# Patient Record
Sex: Female | Born: 1960 | Race: White | Hispanic: No
Health system: Midwestern US, Community
[De-identification: ages and names within clinical notes are randomized; demographics above are authoritative.]

## PROBLEM LIST (undated history)

## (undated) DIAGNOSIS — F329 Major depressive disorder, single episode, unspecified: Secondary | ICD-10-CM

## (undated) DIAGNOSIS — F419 Anxiety disorder, unspecified: Secondary | ICD-10-CM

## (undated) DIAGNOSIS — K922 Gastrointestinal hemorrhage, unspecified: Secondary | ICD-10-CM

## (undated) DIAGNOSIS — F4310 Post-traumatic stress disorder, unspecified: Secondary | ICD-10-CM

## (undated) DIAGNOSIS — E079 Disorder of thyroid, unspecified: Secondary | ICD-10-CM

## (undated) DIAGNOSIS — F32A Depression, unspecified: Secondary | ICD-10-CM

## (undated) DIAGNOSIS — J449 Chronic obstructive pulmonary disease, unspecified: Secondary | ICD-10-CM

## (undated) DIAGNOSIS — R079 Chest pain, unspecified: Principal | ICD-10-CM

## (undated) HISTORY — PX: APPENDECTOMY: SHX54

## (undated) HISTORY — PX: TUMOR REMOVAL: SHX12

## (undated) HISTORY — PX: TONSILLECTOMY: SUR1361

---

## 2015-01-01 ENCOUNTER — Emergency Department
Admission: EM | Admit: 2015-01-01 | Discharge: 2015-01-01 | Disposition: A | Discharge status: Home / Self Care | Payer: Medicare Other | Attending: Emergency Medicine | Admitting: Emergency Medicine

## 2015-01-01 ENCOUNTER — Emergency Department: Payer: Medicare Other

## 2015-01-01 ENCOUNTER — Encounter: Payer: Self-pay | Admitting: Emergency Medicine

## 2015-01-01 DIAGNOSIS — R0602 Shortness of breath: Secondary | ICD-10-CM | POA: Diagnosis present

## 2015-01-01 DIAGNOSIS — J441 Chronic obstructive pulmonary disease with (acute) exacerbation: Secondary | ICD-10-CM | POA: Diagnosis not present

## 2015-01-01 DIAGNOSIS — R42 Dizziness and giddiness: Secondary | ICD-10-CM | POA: Insufficient documentation

## 2015-01-01 HISTORY — DX: Gastrointestinal hemorrhage, unspecified: K92.2

## 2015-01-01 HISTORY — DX: Post-traumatic stress disorder, unspecified: F43.10

## 2015-01-01 HISTORY — DX: Chronic obstructive pulmonary disease, unspecified: J44.9

## 2015-01-01 HISTORY — DX: Major depressive disorder, single episode, unspecified: F32.9

## 2015-01-01 HISTORY — DX: Disorder of thyroid, unspecified: E07.9

## 2015-01-01 HISTORY — DX: Anxiety disorder, unspecified: F41.9

## 2015-01-01 HISTORY — DX: Depression, unspecified: F32.A

## 2015-01-01 LAB — CBC
HEMATOCRIT: 41.7 % (ref 35.0–47.0)
HEMOGLOBIN: 12.9 g/dL (ref 12.0–16.0)
MCH: 22.5 pg — AB (ref 26.0–34.0)
MCHC: 30.9 g/dL — ABNORMAL LOW (ref 32.0–36.0)
MCV: 72.8 fL — AB (ref 80.0–100.0)
Platelets: 361 10*3/uL (ref 150–440)
RBC: 5.73 MIL/uL — AB (ref 3.80–5.20)
RDW: 18.3 % — ABNORMAL HIGH (ref 11.5–14.5)
WBC: 6.9 10*3/uL (ref 3.6–11.0)

## 2015-01-01 LAB — BASIC METABOLIC PANEL
ANION GAP: 10 (ref 5–15)
BUN: 13 mg/dL (ref 6–20)
CHLORIDE: 97 mmol/L — AB (ref 101–111)
CO2: 32 mmol/L (ref 22–32)
Calcium: 10.1 mg/dL (ref 8.9–10.3)
Creatinine, Ser: 0.3 mg/dL — ABNORMAL LOW (ref 0.44–1.00)
GFR calc non Af Amer: >60 mL/min (ref >60)
Glucose, Bld: 91 mg/dL (ref 65–99)
Potassium: 4.3 mmol/L (ref 3.5–5.1)
Sodium: 139 mmol/L (ref 135–145)

## 2015-01-01 MED ORDER — ACETAMINOPHEN 500 MG PO TABS
ORAL_TABLET | ORAL | Status: DC
Start: 2015-01-01 — End: 2015-01-02
  Filled 2015-01-01: qty 2

## 2015-01-01 MED ORDER — IPRATROPIUM-ALBUTEROL 0.5-2.5 (3) MG/3ML IN SOLN
3.0000 mL | Freq: Once | RESPIRATORY_TRACT | Status: AC
  Administered 2015-01-01: 3 mL via RESPIRATORY_TRACT
  Filled 2015-01-01: qty 3

## 2015-01-01 MED ORDER — ACETAMINOPHEN 500 MG PO TABS
1000.0000 mg | ORAL_TABLET | Freq: Once | ORAL | Status: AC
  Administered 2015-01-01: 1000 mg via ORAL

## 2015-01-01 MED ORDER — ALBUTEROL SULFATE (2.5 MG/3ML) 0.083% IN NEBU
5.0000 mg | INHALATION_SOLUTION | Freq: Once | RESPIRATORY_TRACT | Status: DC
  Filled 2015-01-01: qty 6

## 2015-01-01 NOTE — ED Notes (Signed)
Pt is homeless and lives in her car. Pt has chronic lung disease and uses a machine that helps support her respiration and oxygen.

## 2015-01-01 NOTE — Discharge Instructions (Signed)
Chronic Obstructive Pulmonary Disease Chronic obstructive pulmonary disease (COPD) is a common lung condition in which airflow from the lungs is limited. COPD is a general term that can be used to describe many different lung problems that limit airflow, including both chronic bronchitis and emphysema. If you have COPD, your lung function will probably never return to normal, but there are measures you can take to improve lung function and make yourself feel better. CAUSES   Smoking (common).  Exposure to secondhand smoke.  Genetic problems.  Chronic inflammatory lung diseases or recurrent infections. SYMPTOMS  Shortness of breath, especially with physical activity.  Deep, persistent (chronic) cough with a large amount of thick mucus.  Wheezing.  Rapid breaths (tachypnea).  Gray or bluish discoloration (cyanosis) of the skin, especially in your fingers, toes, or lips.  Fatigue.  Weight loss.  Frequent infections or episodes when breathing symptoms become much worse (exacerbations).  Chest tightness. DIAGNOSIS Your health care provider will take a medical history and perform a physical examination to diagnose COPD. Additional tests for COPD may include:  Lung (pulmonary) function tests.  Chest X-ray.  CT scan.  Blood tests. TREATMENT  Treatment for COPD may include:  Inhaler and nebulizer medicines. These help manage the symptoms of COPD and make your breathing more comfortable.  Supplemental oxygen. Supplemental oxygen is only helpful if you have a low oxygen level in your blood.  Exercise and physical activity. These are beneficial for nearly all people with COPD.  Lung surgery or transplant.  Nutrition therapy to gain weight, if you are underweight.  Pulmonary rehabilitation. This may involve working with a team of health care providers and specialists, such as respiratory, occupational, and physical therapists. HOME CARE INSTRUCTIONS  Take all medicines  (inhaled or pills) as directed by your health care provider.  Avoid over-the-counter medicines or cough syrups that dry up your airway (such as antihistamines) and slow down the elimination of secretions unless instructed otherwise by your health care provider.  If you are a smoker, the most important thing that you can do is stop smoking. Continuing to smoke will cause further lung damage and breathing trouble. Ask your health care provider for help with quitting smoking. He or she can direct you to community resources or hospitals that provide support.  Avoid exposure to irritants such as smoke, chemicals, and fumes that aggravate your breathing.  Use oxygen therapy and pulmonary rehabilitation if directed by your health care provider. If you require home oxygen therapy, ask your health care provider whether you should purchase a pulse oximeter to measure your oxygen level at home.  Avoid contact with individuals who have a contagious illness.  Avoid extreme temperature and humidity changes.  Eat healthy foods. Eating smaller, more frequent meals and resting before meals may help you maintain your strength.  Stay active, but balance activity with periods of rest. Exercise and physical activity will help you maintain your ability to do things you want to do.  Preventing infection and hospitalization is very important when you have COPD. Make sure to receive all the vaccines your health care provider recommends, especially the pneumococcal and influenza vaccines. Ask your health care provider whether you need a pneumonia vaccine.  Learn and use relaxation techniques to manage stress.  Learn and use controlled breathing techniques as directed by your health care provider. Controlled breathing techniques include:  Pursed lip breathing. Start by breathing in (inhaling) through your nose for 1 second. Then, purse your lips as if you were   going to whistle and breathe out (exhale) through the  pursed lips for 2 seconds.  Diaphragmatic breathing. Start by putting one hand on your abdomen just above your waist. Inhale slowly through your nose. The hand on your abdomen should move out. Then purse your lips and exhale slowly. You should be able to feel the hand on your abdomen moving in as you exhale.  Learn and use controlled coughing to clear mucus from your lungs. Controlled coughing is a series of short, progressive coughs. The steps of controlled coughing are: 1. Lean your head slightly forward. 2. Breathe in deeply using diaphragmatic breathing. 3. Try to hold your breath for 3 seconds. 4. Keep your mouth slightly open while coughing twice. 5. Spit any mucus out into a tissue. 6. Rest and repeat the steps once or twice as needed. SEEK MEDICAL CARE IF:  You are coughing up more mucus than usual.  There is a change in the color or thickness of your mucus.  Your breathing is more labored than usual.  Your breathing is faster than usual. SEEK IMMEDIATE MEDICAL CARE IF:  You have shortness of breath while you are resting.  You have shortness of breath that prevents you from:  Being able to talk.  Performing your usual physical activities.  You have chest pain lasting longer than 5 minutes.  Your skin color is more cyanotic than usual.  You measure low oxygen saturations for longer than 5 minutes with a pulse oximeter. MAKE SURE YOU:  Understand these instructions.  Will watch your condition.  Will get help right away if you are not doing well or get worse.   This information is not intended to replace advice given to you by your health care provider. Make sure you discuss any questions you have with your health care provider.   Document Released: 10/18/2004 Document Revised: 01/29/2014 Document Reviewed: 09/04/2012 Elsevier Interactive Patient Education 2016 [employer]  

## 2015-01-01 NOTE — ED Notes (Signed)
Patient presents to the ED for shortness of breath.  Patient reports being recently hospitalized and states she did not feel better after hospitalization.  Patient reports being homeless and living in her car.  Patient reports needing to be on continuous oxygen.  Patient is very anxious and slightly belligerent.  Patient is speaking in full sentences.
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
## 2015-01-01 NOTE — ED Provider Notes (Signed)
[facility] Emergency Department Provider Note     Time seen: ----------------------------------------- 7:40 PM on 01/01/2015 -----------------------------------------    I have reviewed the triage vital signs and the nursing notes.   HISTORY  Chief Complaint Shortness of Breath    HPI Maria Carey is a 54 y.o. female presents ER for shortness of breath. Patient reports recently being hospitalized states she did not feel any better after hospitalization. Patient states she's homeless living in her car, she reports needing continuous oxygen, complains of pain when she breathes. Patient denies any fever or chills, reports chest pain and trouble breathing. Nothing makes her symptoms better.   Past Medical History  Diagnosis Date  . COPD (chronic obstructive pulmonary disease) (HCC)   . Thyroid disease   . GI bleed   . PTSD (post-traumatic stress disorder)   . Anxiety   . Depression     There are no active problems to display for this patient.   Past Surgical History  Procedure Laterality Date  . Tonsillectomy    . Appendectomy    . Cesarean section      x2  . Tumor removal      x2    Allergies Review of patient's allergies indicates no known allergies.  Social History Social History  Substance Use Topics  . Smoking status: Never Smoker   . Smokeless tobacco: None  . Alcohol Use: No    Review of Systems Constitutional: Negative for fever. Eyes: Negative for visual changes. ENT: Negative for sore throat. Cardiovascular: Negative for chest pain. Respiratory: Positive for shortness of breath Gastrointestinal: Negative for abdominal pain, vomiting and diarrhea. Genitourinary: Negative for dysuria. Musculoskeletal: Negative for back pain. Skin: Negative for rash. Neurological: Negative for headaches, focal weakness or numbness. Positive for dizziness  10-point ROS otherwise  negative.  ____________________________________________   PHYSICAL EXAM:  VITAL SIGNS: ED Triage Vitals  Enc Vitals Group     BP 01/01/15 1533 117/91 mmHg     Pulse Rate 01/01/15 1533 111     Resp 01/01/15 1533 26     Temp 01/01/15 1533 98 F (36.7 C)     Temp Source 01/01/15 1533 Oral     SpO2 01/01/15 1533 98 %     Weight 01/01/15 1533 75 lb (34.02 kg)     Height 01/01/15 1533 5\' 7"  (1.702 m)     Head Cir --      Peak Flow --      Pain Score 01/01/15 1535 10     Pain Loc --      Pain Edu? --      Excl. in GC? --     Constitutional: Alert, anxious, no acute distress. Eyes: Conjunctivae are normal. PERRL. Normal extraocular movements. ENT   Head: Normocephalic and atraumatic.   Nose: No congestion/rhinnorhea.   Mouth/Throat: Mucous membranes are moist.   Neck: No stridor. Cardiovascular: Rapid rate, regular rhythm. Normal and symmetric distal pulses are present in all extremities. No murmurs, rubs, or gallops. Respiratory: Mild tachypnea with mild wheezing bilaterally. Gastrointestinal: Soft and nontender. No distention. No abdominal bruits.  Musculoskeletal: Nontender with normal range of motion in all extremities. No joint effusions.  No lower extremity tenderness nor edema. Neurologic:  Normal speech and language. No gross focal neurologic deficits are appreciated. Rapid speech Skin:  Skin is warm, dry and intact. No rash noted. Psychiatric: Elevated mood, pushed speech ____________________________________________  EKG: Interpreted by me. Sinus tachycardia with a rate of 109 bpm, normal PR interval,  normal QS with, normal QT interval. Pulmonary disease pattern.  ____________________________________________  ED COURSE:  Pertinent labs & imaging results that were available during my care of the patient were reviewed by me and considered in my medical decision making (see chart for details). Patient with COPD and chronic dyspnea. I'm unclear if there is a  new issue today or not. Will check basic labs and chest x-ray. ____________________________________________    LABS (pertinent positives/negatives)  Labs Reviewed  BASIC METABOLIC PANEL - Abnormal; Notable for the following:    Chloride 97 (*)    Creatinine, Ser 0.30 (*)    All other components within normal limits  CBC - Abnormal; Notable for the following:    RBC 5.73 (*)    MCV 72.8 (*)    MCH 22.5 (*)    MCHC 30.9 (*)    RDW 18.3 (*)    All other components within normal limits  BLOOD GAS, VENOUS - Abnormal; Notable for the following:    pH, Ven 7.31 (*)    pCO2, Ven 69 (*)    Bicarbonate 34.7 (*)    Acid-Base Excess 6.0 (*)    All other components within normal limits    RADIOLOGY Images were viewed by me  IMPRESSION: No active disease. Hyperinflation is noted. Osteopenia and mild degenerative changes thoracic spine.  ____________________________________________  FINAL ASSESSMENT AND PLAN  COPD  Plan: Patient with labs and imaging as dictated above. Patient with chronic COPD and hypercarbia. I suspect this is her baseline. I have offered for her to stay at the homeless shelter. At this point I do not see any acute emergency medical condition.   Emily Filbert, MD   Emily Filbert, MD 01/01/15 [phone]

## 2015-01-01 NOTE — ED Notes (Signed)
Spoke to Care Management regarding patient.  Patient states, "I just really need some help."  Patient left [place] today after a hospitalization stating, "they really didn't help me."  Patient states she was living in [place] in her car until March of this year when she moved to [place] to receive help from her family.  Family refused to help patient, per patient and she still had to live in her car.  Patient left [place] in August to go to [place] because she lived there before.  Patient states, "[facility] [place] with help me."

## 2015-01-01 NOTE — ED Notes (Signed)
While introducing myself to the patient and attempting to connect her up to continuous monitoring, patient became anxious and did not allow this nurse to continue care due to complaints of a strong odor of deoderant or cologne.  This nurse is not wearing cologne.  Charge nurse notified.  Will have another care for this patient.

## 2015-01-05 LAB — BLOOD GAS, VENOUS
ACID-BASE EXCESS: 6 mmol/L — AB (ref 0.0–3.0)
Bicarbonate: 34.7 mEq/L — ABNORMAL HIGH (ref 21.0–28.0)
PATIENT TEMPERATURE: 37
pCO2, Ven: 69 mmHg — ABNORMAL HIGH (ref 44.0–60.0)
pH, Ven: 7.31 — ABNORMAL LOW (ref 7.320–7.430)

## 2015-07-29 ENCOUNTER — Emergency Department: Admit: 2015-07-30 | Payer: MEDICARE

## 2015-07-29 ENCOUNTER — Emergency Department: Payer: MEDICARE

## 2015-07-29 DIAGNOSIS — J441 Chronic obstructive pulmonary disease with (acute) exacerbation: Principal | ICD-10-CM

## 2015-07-29 NOTE — ED Notes (Signed)
Patient states her current IV is tender and does not wish to have contrast through it. Attempt to start additional IV was unsuccessful. Provider notified, patient returned to ED for further evaluation.

## 2015-07-29 NOTE — ED Notes (Signed)
Patient arrives today via private vehicle. Patient states she is from [place] and was told to leave the place she was living in by some official. Patient states she has been driving around trying to find a place to live and has been living in her car. Patient states she began having chest pain and her O2 compressor was no longer sufficient.

## 2015-07-29 NOTE — ED Triage Notes (Signed)
Pt to er with c/o chest pain that started this morning. Pain is intermittent, not constant

## 2015-07-29 NOTE — ED Notes (Signed)
D/w DR Coca

## 2015-07-29 NOTE — ED Notes (Signed)
Report received from Courtney, RN, assumed patient care.

## 2015-07-29 NOTE — ED Notes (Signed)
Change of shift report given to Nadine CountsBob, [place]

## 2015-07-29 NOTE — ED Notes (Signed)
Very diminished B, no LE edema

## 2015-07-29 NOTE — ED Provider Notes (Signed)
Patient is a 55 y.o. female presenting with chest pain. The history is provided by the patient.   Chest Pain (Angina)    This is a new problem. The current episode started 12 to 24 hours ago. Duration of episode(s) is 1 minute. Episode frequency: multiple episodes today. The pain is associated with normal activity. The pain is present in the left side. The pain is at a severity of 5/10. The quality of the pain is described as sharp. The pain does not radiate. Associated symptoms include cough and shortness of breath. Pertinent negatives include no abdominal pain, no back pain, no claudication, no diaphoresis, no dizziness, no exertional chest pressure, no fever, no headaches, no hemoptysis, no irregular heartbeat, no leg pain, no lower extremity edema, no malaise/fatigue, no nausea, no near-syncope, no numbness, no orthopnea, no palpitations, no PND, no sputum production, no vomiting and no weakness. She has tried nothing for the symptoms. Her past medical history is significant for cancer.Her past medical history does not include aneurysm, DM, DVT, HTN, PE or CHF. Past workup comments: none.        Past Medical History:   Diagnosis Date   ??? Chronic obstructive pulmonary disease (HCC)     emphy   ??? Endocrine disease     hypo thyroid   ??? Psychiatric disorder     anxiety, PTSD, depression       Past Surgical History:   Procedure Laterality Date   ??? HX APPENDECTOMY     ??? HX HEENT     ??? HX ORTHOPAEDIC      rt hip tumor removed         History reviewed. No pertinent family history.    Social History     Social History   ??? Marital status: DIVORCED     Spouse name: N/A   ??? Number of children: N/A   ??? Years of education: N/A     Occupational History   ??? Not on file.     Social History Main Topics   ??? Smoking status: Former Smoker   ??? Smokeless tobacco: Never Used   ??? Alcohol use No   ??? Drug use: No   ??? Sexual activity: Not on file     Other Topics Concern   ??? Not on file     Social History Narrative    ??? No narrative on file         ALLERGIES: Review of patient's allergies indicates no known allergies.    Review of Systems   Constitutional: Negative for chills, diaphoresis, fever and malaise/fatigue.   HENT: Negative for congestion, dental problem, ear discharge, ear pain, rhinorrhea, sinus pressure, sore throat and trouble swallowing.    Eyes: Negative for photophobia, pain and discharge.   Respiratory: Positive for cough and shortness of breath. Negative for hemoptysis, sputum production, chest tightness, wheezing and stridor.    Cardiovascular: Positive for chest pain. Negative for palpitations, orthopnea, claudication, leg swelling, PND and near-syncope.   Gastrointestinal: Negative for abdominal pain, blood in stool, constipation, diarrhea, nausea and vomiting.   Genitourinary: Negative for decreased urine volume, difficulty urinating, dysuria, flank pain, frequency, hematuria and urgency.   Musculoskeletal: Negative for arthralgias, back pain, myalgias, neck pain and neck stiffness.   Skin: Negative for color change, rash and wound.   Neurological: Negative for dizziness, seizures, syncope, weakness, light-headedness, numbness and headaches.   Hematological: Negative for adenopathy. Does not bruise/bleed easily.   Psychiatric/Behavioral: Negative for agitation, self-injury and suicidal ideas.  The patient is not nervous/anxious.    All other systems reviewed and are negative.      Vitals:    07/29/15 2040 07/29/15 2041 07/29/15 2056   BP:  119/69    Resp:   14   Weight: 38.6 kg (85 lb)     Height: 5\' 7"  (1.702 m)              Physical Exam   Constitutional: She is oriented to person, place, and time. She appears well-developed. She appears cachectic.  Non-toxic appearance. She has a sickly appearance. She does not appear ill. No distress.   HENT:   Head: Normocephalic and atraumatic.   Right Ear: External ear normal.   Left Ear: External ear normal.   Nose: Nose normal.    Mouth/Throat: Oropharynx is clear and moist. No oropharyngeal exudate.   Eyes: Conjunctivae are normal. Pupils are equal, round, and reactive to light. No scleral icterus.   Neck: Normal range of motion. Neck supple. No JVD present. No tracheal deviation present. No thyromegaly present.   Cardiovascular: Normal rate, normal heart sounds and intact distal pulses.    No murmur heard.  Pulmonary/Chest: Effort normal. No respiratory distress. She has decreased breath sounds. She has no wheezes. She has no rales. She exhibits no tenderness.   Abdominal: Soft. Bowel sounds are normal. She exhibits no distension and no mass. There is no tenderness. There is no rebound and no guarding.   Musculoskeletal: She exhibits no edema or tenderness.   Lymphadenopathy:     She has no cervical adenopathy.   Neurological: She is alert and oriented to person, place, and time. She has normal strength and normal reflexes. No cranial nerve deficit or sensory deficit. GCS eye subscore is 4. GCS verbal subscore is 5. GCS motor subscore is 6.   Skin: Skin is warm and dry. No rash noted.   No track marks noted   Psychiatric: She has a normal mood and affect. Her behavior is normal.   Nursing note and vitals reviewed.       MDM  Number of Diagnoses or Management Options  Chest pain, unspecified type: new and requires workup  Homeless single person: new and requires workup  SOB (shortness of breath): new and requires workup     Amount and/or Complexity of Data Reviewed  Clinical lab tests: reviewed and ordered  Tests in the radiology section of CPT??: reviewed and ordered  Discuss the patient with other providers: yes  Independent visualization of images, tracings, or specimens: yes    Critical Care  Total time providing critical care: 30-74 minutes    ED Course       Procedures    upon my initial contact with the patient, i introduced myself and informed all present that the named attending physician would be involved in their  care from its onset, asked patient if they had questions and gave them a run down of tentative treatment plan. Patient voiced understanding

## 2015-07-30 ENCOUNTER — Inpatient Hospital Stay
Admit: 2015-07-30 | Discharge: 2015-08-03 | Disposition: A | Discharge status: Home / Self Care | Payer: MEDICARE | Attending: Internal Medicine | Admitting: Internal Medicine

## 2015-07-30 LAB — EKG, 12 LEAD, INITIAL
Atrial Rate: 97 {beats}/min
Calculated P Axis: 88 degrees
Calculated R Axis: 85 degrees
Calculated T Axis: 76 degrees
P-R Interval: 110 ms
Q-T Interval: 346 ms
QRS Duration: 70 ms
QTC Calculation (Bezet): 439 ms
Ventricular Rate: 97 {beats}/min

## 2015-07-30 LAB — METABOLIC PANEL, COMPREHENSIVE
A-G Ratio: 0.9 — ABNORMAL LOW (ref 1.2–2.2)
ALT (SGPT): 18 U/L (ref 12–78)
AST (SGOT): 14 U/L — ABNORMAL LOW (ref 15–37)
Albumin: 3.6 g/dL (ref 3.4–5.0)
Alk. phosphatase: 121 U/L — ABNORMAL HIGH (ref 45–117)
Anion gap: 7 mmol/L (ref 6–15)
BUN/Creatinine ratio: 18 (ref 7–25)
BUN: 10 MG/DL (ref 7–18)
Bilirubin, total: 0.3 MG/DL (ref <1.1)
CO2: 31 mmol/L (ref 21–32)
Calcium: 8.9 MG/DL (ref 8.5–10.1)
Chloride: 100 mmol/L (ref 98–107)
Creatinine: 0.55 MG/DL — ABNORMAL LOW (ref 0.60–1.30)
GFR est AA: >60 mL/min/{1.73_m2} (ref >60)
GFR est non-AA: >60 mL/min/{1.73_m2} (ref >60)
Globulin: 4.1 g/dL — ABNORMAL HIGH (ref 2.4–3.5)
Glucose: 110 mg/dL (ref 70–110)
Potassium: 4.2 mmol/L (ref 3.5–5.3)
Protein, total: 7.7 g/dL (ref 6.4–8.2)
Sodium: 138 mmol/L (ref 136–145)

## 2015-07-30 LAB — BLOOD GAS, ARTERIAL
BASE EXCESS: 8.1 mmol/L
BICARBONATE: 31 mmol/L — ABNORMAL HIGH (ref 22–27)
O2 FLOW: 2 L/min
O2 SAT: 97 % (ref 90–100)
PCO2: 62 mmHg — CR (ref 35–45)
PO2: 97 mmHg (ref 80–100)
pH: 7.36 (ref 7.35–7.45)

## 2015-07-30 LAB — CBC WITH AUTOMATED DIFF
ABS. BASOPHILS: 0 10*3/uL (ref 0.0–0.1)
ABS. EOSINOPHILS: 0.1 10*3/uL (ref 0.0–0.5)
ABS. LYMPHOCYTES: 2.2 10*3/uL (ref 0.8–3.5)
ABS. MONOCYTES: 0.5 10*3/uL — ABNORMAL LOW (ref 0.8–3.5)
ABS. NEUTROPHILS: 6.1 10*3/uL (ref 1.5–8.0)
BASOPHILS: 0 % (ref 0–2)
EOSINOPHILS: 1 % (ref 0–5)
HCT: 42.1 % (ref 41–53)
HGB: 12.3 g/dL (ref 12.0–16.0)
LYMPHOCYTES: 25 % (ref 19–48)
MCH: 20.1 PG — ABNORMAL LOW (ref 27–31)
MCHC: 29.2 g/dL — ABNORMAL LOW (ref 31–37)
MCV: 68.7 FL — ABNORMAL LOW (ref 80–100)
MONOCYTES: 6 % (ref 3–9)
MPV: 9.4 FL (ref 5.9–10.3)
NEUTROPHILS: 68 % (ref 40–74)
PLATELET: 390 10*3/uL (ref 130–400)
RBC: 6.13 M/uL — ABNORMAL HIGH (ref 4.2–5.4)
RDW: 16.8 % — ABNORMAL HIGH (ref 11.5–14.5)
WBC: 9 10*3/uL (ref 4.5–10.8)

## 2015-07-30 LAB — TROPONIN I
Troponin-I, Qt.: <0.02 ng/mL (ref 0.00–0.05)
Troponin-I, Qt.: <0.02 ng/mL (ref 0.00–0.05)

## 2015-07-30 LAB — EKG 12-LEAD
Atrial Rate: 97 {beats}/min
P Axis: 88 degrees
P-R Interval: 110 ms
Q-T Interval: 346 ms
QRS Duration: 70 ms
QTc Calculation (Bazett): 439 ms
R Axis: 85 degrees
T Axis: 76 degrees
Ventricular Rate: 97 {beats}/min

## 2015-07-30 MED ORDER — SODIUM CHLORIDE 0.9 % IJ SYRG
Freq: Three times a day (TID) | INTRAMUSCULAR | Status: DC
Start: 2015-07-30 — End: 2015-07-30
  Administered 2015-07-30 (×2): via INTRAVENOUS

## 2015-07-30 MED ORDER — ASPIRIN 325 MG TAB
325 mg | Freq: Once | ORAL | Status: AC
Start: 2015-07-30 — End: 2015-07-29
  Administered 2015-07-30: 02:00:00 via ORAL

## 2015-07-30 MED ORDER — NITROGLYCERIN 2 % TRANSDERMAL OINTMENT
2 % | Freq: Two times a day (BID) | TRANSDERMAL | Status: DC
Start: 2015-07-30 — End: 2015-07-31
  Administered 2015-07-30 (×2): via TOPICAL

## 2015-07-30 MED ORDER — LEVOTHYROXINE 88 MCG TAB
88 mcg | Freq: Every day | ORAL | Status: DC
Start: 2015-07-30 — End: 2015-08-03
  Administered 2015-07-30 – 2015-08-03 (×5): via ORAL

## 2015-07-30 MED ORDER — IPRATROPIUM BROMIDE 0.02 % SOLN FOR INHALATION
0.02 % | RESPIRATORY_TRACT | Status: AC
Start: 2015-07-30 — End: 2015-07-29
  Administered 2015-07-30: 02:00:00 via RESPIRATORY_TRACT

## 2015-07-30 MED ORDER — METHYLPREDNISOLONE (PF) 125 MG/2 ML IJ SOLR
125 mg/2 mL | Freq: Four times a day (QID) | INTRAMUSCULAR | Status: DC
Start: 2015-07-30 — End: 2015-07-30
  Administered 2015-07-30: 02:00:00 via INTRAVENOUS

## 2015-07-30 MED ORDER — SODIUM CHLORIDE 0.9 % IJ SYRG
INTRAMUSCULAR | Status: DC | PRN
Start: 2015-07-30 — End: 2015-08-03

## 2015-07-30 MED ORDER — ENOXAPARIN 40 MG/0.4 ML SUB-Q SYRINGE
40 mg/0.4 mL | Freq: Every day | SUBCUTANEOUS | Status: DC
Start: 2015-07-30 — End: 2015-08-01
  Administered 2015-07-30: 06:00:00 via SUBCUTANEOUS

## 2015-07-30 MED ORDER — ACETAMINOPHEN 325 MG TABLET
325 mg | Freq: Four times a day (QID) | ORAL | Status: DC | PRN
Start: 2015-07-30 — End: 2015-08-03
  Administered 2015-07-30: 23:00:00 via ORAL

## 2015-07-30 MED ORDER — SODIUM CHLORIDE 0.9 % IJ SYRG
Freq: Three times a day (TID) | INTRAMUSCULAR | Status: DC
Start: 2015-07-30 — End: 2015-08-03
  Administered 2015-07-30 – 2015-08-03 (×14): via INTRAVENOUS

## 2015-07-30 MED ORDER — SODIUM CHLORIDE 0.9 % IJ SYRG
Freq: Three times a day (TID) | INTRAMUSCULAR | Status: DC
Start: 2015-07-30 — End: 2015-07-30
  Administered 2015-07-30 (×2): via INTRAVENOUS

## 2015-07-30 MED ORDER — SODIUM CHLORIDE 0.9% BOLUS IV
0.9 % | Freq: Once | INTRAVENOUS | Status: AC
Start: 2015-07-30 — End: 2015-07-29
  Administered 2015-07-30: 02:00:00 via INTRAVENOUS

## 2015-07-30 MED ORDER — METHYLPREDNISOLONE (PF) 40 MG/ML IJ SOLR
40 mg/mL | Freq: Two times a day (BID) | INTRAMUSCULAR | Status: DC
Start: 2015-07-30 — End: 2015-08-01
  Administered 2015-07-31 – 2015-08-01 (×4): via INTRAVENOUS

## 2015-07-30 MED ORDER — ALBUTEROL SULFATE 2.5 MG/0.5 ML NEB SOLUTION
2.5 mg/0.5 mL | RESPIRATORY_TRACT | Status: DC
Start: 2015-07-30 — End: 2015-08-03
  Administered 2015-07-30 – 2015-08-03 (×15): via RESPIRATORY_TRACT

## 2015-07-30 MED ORDER — IOPAMIDOL 76 % IV SOLN
370 mg iodine /mL (76 %) | Freq: Once | INTRAVENOUS | Status: AC
Start: 2015-07-30 — End: 2015-07-30
  Administered 2015-07-30: 03:00:00 via INTRAVENOUS

## 2015-07-30 MED ORDER — SODIUM CHLORIDE 0.9 % IJ SYRG
INTRAMUSCULAR | Status: DC | PRN
Start: 2015-07-30 — End: 2015-07-30

## 2015-07-30 MED ORDER — TIOTROPIUM BROMIDE 18 MCG CAPS WITH INHALATION DEVICE
18 mcg | Freq: Every day | RESPIRATORY_TRACT | Status: DC
Start: 2015-07-30 — End: 2015-08-03
  Administered 2015-07-30 – 2015-08-03 (×5): via RESPIRATORY_TRACT

## 2015-07-30 MED ORDER — METHYLPREDNISOLONE (PF) 40 MG/ML IJ SOLR
40 mg/mL | Freq: Two times a day (BID) | INTRAMUSCULAR | Status: DC
Start: 2015-07-30 — End: 2015-07-30
  Administered 2015-07-30: 13:00:00 via INTRAVENOUS

## 2015-07-30 MED ORDER — FLUTICASONE 100 MCG-VILANTEROL 25 MCG/DOSE BREATH ACTIVATED INHALER
100-25 mcg/dose | Freq: Every day | RESPIRATORY_TRACT | Status: DC
Start: 2015-07-30 — End: 2015-08-03
  Administered 2015-07-30 – 2015-08-03 (×5): via RESPIRATORY_TRACT

## 2015-07-30 MED FILL — SODIUM CHLORIDE 0.9 % IV: INTRAVENOUS | Qty: 1000

## 2015-07-30 MED FILL — NORMAL SALINE FLUSH 0.9 % INJECTION SYRINGE: INTRAMUSCULAR | Qty: 10

## 2015-07-30 MED FILL — SOLU-MEDROL (PF) 40 MG/ML SOLUTION FOR INJECTION: 40 mg/mL | INTRAMUSCULAR | Qty: 1

## 2015-07-30 MED FILL — ALBUTEROL SULFATE 2.5 MG/0.5 ML NEB SOLUTION: 2.5 mg/0.5 mL | RESPIRATORY_TRACT | Qty: 0.5

## 2015-07-30 MED FILL — SPIRIVA WITH HANDIHALER 18 MCG AND INHALATION CAPSULES: 18 mcg | RESPIRATORY_TRACT | Qty: 5

## 2015-07-30 MED FILL — ACETAMINOPHEN 325 MG TABLET: 325 mg | ORAL | Qty: 2

## 2015-07-30 MED FILL — ISOVUE-370  76 % INTRAVENOUS SOLUTION: 370 mg iodine /mL (76 %) | INTRAVENOUS | Qty: 100

## 2015-07-30 MED FILL — SOLU-MEDROL (PF) 125 MG/2 ML SOLUTION FOR INJECTION: 125 mg/2 mL | INTRAMUSCULAR | Qty: 2

## 2015-07-30 MED FILL — LOVENOX 40 MG/0.4 ML SUBCUTANEOUS SYRINGE: 40 mg/0.4 mL | SUBCUTANEOUS | Qty: 0.4

## 2015-07-30 MED FILL — NITRO-BID 2 % TRANSDERMAL OINTMENT: 2 % | TRANSDERMAL | Qty: 1

## 2015-07-30 MED FILL — LEVOTHYROXINE 88 MCG TAB: 88 mcg | ORAL | Qty: 1

## 2015-07-30 MED FILL — ASPIRIN 325 MG TAB: 325 mg | ORAL | Qty: 1

## 2015-07-30 MED FILL — BREO ELLIPTA 100 MCG-25 MCG/DOSE POWDER FOR INHALATION: 100-25 mcg/dose | RESPIRATORY_TRACT | Qty: 28

## 2015-07-30 NOTE — Progress Notes (Addendum)
Resting in bed without distress. Call light in reach. Assessment complete. Respirations E/U at rest. O2 at 2 L NC. No family at present. Yellow fall risk socks, band and sign in place. Instructed on hourly rounding and fall prevention. Erythema noted to inner buttocks, L > R. Blanchable. Verbalized she had sat in her own feces in her car.  Barrier cream applied.  Tele in place. Will continue to monitor.

## 2015-07-30 NOTE — Progress Notes (Signed)
Patient assessment complete. Patient in no acute signs/symptoms of distress. Patient bed in locked and low position, side rails up x2, call light within reach of the patient. Patient reminded to ask for assistance when needed. Will continue to monitor.    0600: Bedside and Verbal shift change report given to Paula (oncoming nurse) by Amariana Mirando (offgoing nurse). Report included the following information SBAR, Kardex, Procedure Summary, Intake/Output and Recent Results.

## 2015-07-30 NOTE — Progress Notes (Signed)
Internal Medicine Daily Progress Note    Patient: [name] [name]  Sex: female MRN: [id number]    Date of Birth:  [birth year] Age:  55 y.o.                    PCP: None    Treatment Team: Attending Provider: Rexford Maus, MD; Care Manager: Johny Chess, RN    Subjective:     Still with some shortness of breath but improving. Had chest pain yesterday but none today, no fever, no vomiting, no diarrhea    Objective:   Physical Exam:  Visit Vitals   ??? BP 92/50 (BP 1 Location: Right arm, BP Patient Position: At rest)   ??? Pulse 82   ??? Temp 97.9 ??F (36.6 ??C)   ??? Resp 16   ??? Ht  (1.702 m)   ??? Wt 38.9 kg (85 lb 11.2 oz)   ??? SpO2 100%   ??? BMI 13.42 kg/m2      Temp (24hrs), Avg:97.8 ??F (36.6 ??C), Min:97.6 ??F (36.4 ??C), Max:97.9 ??F (36.6 ??C)    Oxygen Therapy  O2 Sat (%): 100 % (07/30/15 0755)  Pulse via Oximetry: 85 beats per minute (07/30/15 0755)  O2 Device: Nasal cannula (07/30/15 0755)  O2 Flow Rate (L/min): 2 l/min (07/30/15 0755)  No intake or output data in the 24 hours ending 07/30/15 1109General: No acute distress  HEENT: Atraumatic without icterus  Neck:  Supple without adenopathy  Lungs: Decreased breath sounds on both lung fields   Heart: Regular rate and rhythm,?? No murmur, rub, or gallop  Abdomen: Soft, Non distended, Non tender, Positive bowel sounds  Extremities: No cyanosis, clubbing or edema  Neurologic:?? No focal deficits  Skin:  No rashes   Mental Status: Alert, Ox3  Psych: Looks anxious    Lab:  CMP:   Lab Results   Component Value Date/Time    NA 138 07/29/2015 08:52 PM    K 4.2 07/29/2015 08:52 PM    CL 100 07/29/2015 08:52 PM    CO2 31 07/29/2015 08:52 PM    AGAP 7 07/29/2015 08:52 PM    GLU 110 07/29/2015 08:52 PM    BUN 10 07/29/2015 08:52 PM    CREA 0.55 (L) 07/29/2015 08:52 PM    GFRAA >60 07/29/2015 08:52 PM    GFRNA >60 07/29/2015 08:52 PM    CA 8.9 07/29/2015 08:52 PM    ALB 3.6 07/29/2015 08:52 PM    TP 7.7 07/29/2015 08:52 PM    GLOB 4.1 (H) 07/29/2015 08:52 PM     AGRAT 0.9 (L) 07/29/2015 08:52 PM    SGOT 14 (L) 07/29/2015 08:52 PM    ALT 18 07/29/2015 08:52 PM     CBC:   Lab Results   Component Value Date/Time    WBC 9.0 07/29/2015 08:52 PM    HGB 12.3 07/29/2015 08:52 PM    HCT 42.1 07/29/2015 08:52 PM    PLT 390 07/29/2015 08:52 PM     All Cardiac Markers in the last 24 hours:   Lab Results   Component Value Date/Time    TROIQ <0.02 07/30/2015 06:00 AM    TROIQ <0.02 07/29/2015 08:52 PM     ?? Radiology films personally reviewed  ?? Medications reviewed    Assessment/Plan     Principal Problem:    Acute exacerbation of chronic obstructive pulmonary disease (COPD) (HCC) (07/30/2015)  Will increase dose of iv solumedrol, on duoneb nebulizer Q 4 hours  Active Problems:    Homeless single person (07/29/2015)  Case manager consulted. At this time, patient seems to be driving aimlessless and has no definite plan on where she will be going      Chest pain (07/29/2015)  For stress test, asa, lipid panel      Physical deconditioning (07/30/2015)  Consult PT OT    Chronic respiratory failure with hypoxia (HCC) (07/30/2015)  o2 supplement      Undernutrition (07/30/2015)  Consult nutrition    She will be changed to full admit because she will need a more aggressive management of Copd exacerbation. Please refer to admission H and P for ROS, PMH, surgical history, family history, medications list, allergies, social history that has not changed.
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
## 2015-07-30 NOTE — Progress Notes (Signed)
2D ADULT ECHO COMPLETED BEDSIDE 305  JOE SWIM,RDCS

## 2015-07-30 NOTE — Progress Notes (Signed)
RN case manager screened patient record for discharge planning needs. Referred to case management due to consult.  CM to follow.

## 2015-07-30 NOTE — Progress Notes (Signed)
Bedside and Verbal shift change report given to Jeremy RN (oncoming nurse) by Jamie L Johnson, RN   (offgoing nurse). Report included the following information SBAR, Kardex and MAR.   ??
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
## 2015-07-30 NOTE — ED Notes (Signed)
TRANSFER - OUT REPORT:    Verbal report given to jeremy(name) on Jacqualine Nealy  being transferred to 3c(unit) for routine progression of care       Report consisted of patient???s Situation, Background, Assessment and   Recommendations(SBAR).     Information from the following report(s) ED Summary, MAR and Recent Results was reviewed with the receiving nurse.    Lines:   Peripheral IV 07/29/15 Right Wrist (Active)   Site Assessment Clean, dry, & intact 07/29/2015  8:55 PM   Phlebitis Assessment 0 07/29/2015  8:55 PM   Infiltration Assessment 0 07/29/2015  8:55 PM   Dressing Status Clean, dry, & intact 07/29/2015  8:55 PM   Dressing Type Transparent 07/29/2015  8:55 PM   Hub Color/Line Status Pink;Flushed;Patent 07/29/2015  8:55 PM   Action Taken Blood drawn 07/29/2015  8:55 PM        Opportunity for questions and clarification was provided.      Patient transported with:   [employer]

## 2015-07-30 NOTE — Progress Notes (Signed)
Spoke with Dr. Wyatt Portelaoca regarding patient's request for nasal spray, orders received, primary nurse notified.

## 2015-07-31 LAB — LIPID PANEL
Cholesterol, total: 213 MG/DL — ABNORMAL HIGH (ref <200)
HDL Cholesterol: 71 MG/DL (ref 32–96)
LDL, calculated: 132.56 MG/DL — ABNORMAL HIGH (ref <130)
Triglyceride: 59 MG/DL (ref <150)
VLDL, calculated: 11.8 MG/DL (ref 5–32)

## 2015-07-31 LAB — CBC WITH AUTOMATED DIFF
ABS. BASOPHILS: 0 10*3/uL (ref 0.0–0.1)
ABS. EOSINOPHILS: 0 10*3/uL (ref 0.0–0.5)
ABS. LYMPHOCYTES: 0.5 10*3/uL — ABNORMAL LOW (ref 0.8–3.5)
ABS. MONOCYTES: 0.1 10*3/uL — ABNORMAL LOW (ref 2.0–8.0)
ABS. NEUTROPHILS: 5.5 10*3/uL (ref 1.5–8.0)
BASOPHILS: 0 % (ref 0–2)
EOSINOPHILS: 0 % (ref 0–5)
HCT: 34.5 % — ABNORMAL LOW (ref 41–53)
HGB: 9.7 g/dL — ABNORMAL LOW (ref 12.0–16.0)
LYMPHOCYTES: 9 % — ABNORMAL LOW (ref 19–48)
MCH: 19.4 PG — ABNORMAL LOW (ref 27–31)
MCHC: 28.1 g/dL — ABNORMAL LOW (ref 31–37)
MCV: 69 FL — ABNORMAL LOW (ref 80–100)
MONOCYTES: 1 % — ABNORMAL LOW (ref 3–9)
MPV: 9.6 FL (ref 5.9–10.3)
NEUTROPHILS: 90 % — ABNORMAL HIGH (ref 40–74)
PLATELET: 371 10*3/uL (ref 130–400)
RBC: 5 M/uL (ref 4.2–5.4)
RDW: 16.9 % — ABNORMAL HIGH (ref 11.5–14.5)
WBC: 6.1 10*3/uL (ref 4.5–10.8)

## 2015-07-31 LAB — METABOLIC PANEL, COMPREHENSIVE
A-G Ratio: 0.9 — ABNORMAL LOW (ref 1.2–2.2)
ALT (SGPT): 14 U/L (ref 12–78)
AST (SGOT): 12 U/L — ABNORMAL LOW (ref 15–37)
Albumin: 2.8 g/dL — ABNORMAL LOW (ref 3.4–5.0)
Alk. phosphatase: 89 U/L (ref 45–117)
Anion gap: 6 mmol/L (ref 6–15)
BUN/Creatinine ratio: 47 — ABNORMAL HIGH (ref 7–25)
BUN: 15 MG/DL (ref 7–18)
Bilirubin, total: 0.1 MG/DL (ref <1.1)
CO2: 30 mmol/L (ref 21–32)
Calcium: 8.2 MG/DL — ABNORMAL LOW (ref 8.5–10.1)
Chloride: 105 mmol/L (ref 98–107)
Creatinine: 0.32 MG/DL — ABNORMAL LOW (ref 0.60–1.30)
GFR est AA: >60 mL/min/{1.73_m2} (ref >60)
GFR est non-AA: >60 mL/min/{1.73_m2} (ref >60)
Globulin: 3.2 g/dL (ref 2.4–3.5)
Glucose: 143 mg/dL — ABNORMAL HIGH (ref 70–110)
Potassium: 4.1 mmol/L (ref 3.5–5.3)
Protein, total: 6 g/dL — ABNORMAL LOW (ref 6.4–8.2)
Sodium: 141 mmol/L (ref 136–145)

## 2015-07-31 LAB — TROPONIN I: Troponin-I, Qt.: <0.02 ng/mL (ref 0.00–0.05)

## 2015-07-31 MED ORDER — ATORVASTATIN 10 MG TAB
10 mg | Freq: Every evening | ORAL | Status: DC
Start: 2015-07-31 — End: 2015-08-03
  Administered 2015-08-01 – 2015-08-03 (×3): via ORAL

## 2015-07-31 MED ORDER — FAMOTIDINE 20 MG TAB
20 mg | Freq: Two times a day (BID) | ORAL | Status: DC
Start: 2015-07-31 — End: 2015-08-03
  Administered 2015-08-03 (×2): via ORAL

## 2015-07-31 MED ORDER — LORAZEPAM 0.5 MG TAB
0.5 mg | Freq: Two times a day (BID) | ORAL | Status: DC | PRN
Start: 2015-07-31 — End: 2015-08-03
  Administered 2015-07-31: 17:00:00 via ORAL

## 2015-07-31 MED ORDER — ASPIRIN 81 MG CHEWABLE TAB
81 mg | Freq: Every day | ORAL | Status: DC
Start: 2015-07-31 — End: 2015-08-03
  Administered 2015-07-31 – 2015-08-03 (×4): via ORAL

## 2015-07-31 MED ORDER — SODIUM CHLORIDE 0.65 % NASAL SPRAY AEROSOL
0.65 % | NASAL | Status: DC | PRN
Start: 2015-07-31 — End: 2015-08-03
  Administered 2015-07-31: 02:00:00 via NASAL

## 2015-07-31 MED FILL — SOLU-MEDROL (PF) 40 MG/ML SOLUTION FOR INJECTION: 40 mg/mL | INTRAMUSCULAR | Qty: 1

## 2015-07-31 MED FILL — ASPIRIN 81 MG CHEWABLE TAB: 81 mg | ORAL | Qty: 1

## 2015-07-31 MED FILL — ALBUTEROL SULFATE 2.5 MG/0.5 ML NEB SOLUTION: 2.5 mg/0.5 mL | RESPIRATORY_TRACT | Qty: 0.5

## 2015-07-31 MED FILL — LORAZEPAM 0.5 MG TAB: 0.5 mg | ORAL | Qty: 1

## 2015-07-31 MED FILL — LOVENOX 40 MG/0.4 ML SUBCUTANEOUS SYRINGE: 40 mg/0.4 mL | SUBCUTANEOUS | Qty: 0.4

## 2015-07-31 MED FILL — FAMOTIDINE 20 MG TAB: 20 mg | ORAL | Qty: 1

## 2015-07-31 MED FILL — LEVOTHYROXINE 88 MCG TAB: 88 mcg | ORAL | Qty: 1

## 2015-07-31 MED FILL — LITTLE REMEDIES 0.65 % NASAL SPRAY AEROSOL: 0.65 % | NASAL | Qty: 30
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
## 2015-07-31 NOTE — Progress Notes (Signed)
Orders received & chart reviewed. Attempted initial evaluation, but patient pleasantly refused at this time because she reports she did not get much sleep last night & needed to rest. Will check back later.

## 2015-07-31 NOTE — Progress Notes (Signed)
Problem: Nutrition Deficit  Goal: *Optimize nutritional status  Patient w/ malnutrition. Patient will have a weight gain by 08/02/15.   Outcome: Progressing Towards Goal  Nutrition assessment complete. Severely malnourished as evidenced by BMI 14.27, patient-stated wt loss >33# x past year (27.85% decrease), diminished PO intake related to limited income and homelessness, and visible fat and muscle wasting (spine, clavicle, tricep) noted upon nutrition-focused physical examination. PO intake only 33% average x last 3 documented meals on Cardiac diet. PMH includes COPD. RD added [facility] TID and will continue to follow.

## 2015-07-31 NOTE — Progress Notes (Addendum)
Patient assessment complete. Patient in no acute signs/symptoms of distress. Patient bed in locked and low position, side rails up x2, call light within reach of the patient. Patient reminded to ask for assistance when needed. Will continue to monitor.    0600: Bedside and Verbal shift change report given to Kara (oncoming nurse) by Jazalyn Mondor (offgoing nurse). Report included the following information SBAR, Kardex, Procedure Summary, Intake/Output and Recent Results.
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
## 2015-07-31 NOTE — Progress Notes (Signed)
Problem: Falls - Risk of  Goal: *Absence of falls  Patient in new environment and at risk for a fall. Patient will not fall through 08/02/15   Outcome: Progressing Towards Goal  No falls this shift.  Goal: *Knowledge of fall prevention  Patient will be reminded to use call light for assistance through 08/02/15.   Outcome: Progressing Towards Goal  Uses call light when assistance is needed.    Problem: Patient Education: Go to Patient Education Activity  Goal: Patient/Family Education  Outcome: Resolved/Met Date Met:  07/31/15  See education.    Problem: Nutrition Deficit  Goal: *Optimize nutritional status  Patient w/ malnutrition. Patient will have a weight gain by 08/02/15.   Outcome: Progressing Towards Goal  Patient has gained 8oz since admission and tolerating 30-40% of meals    Problem: Patient Education: Go to Patient Education Activity  Goal: Patient/Family Education  Outcome: Resolved/Met Date Met:  07/31/15  See education.    Problem: Pressure Injury - Risk of  Goal: *Prevention of pressure ulcer  Patient with malnutrition making patient at risk for pressure ulcer development. Patient will not develop a pressure ulcer through 08/02/15.   Outcome: Progressing Towards Goal  No s/s skin breakdown reported this shift.    Problem: Patient Education: Go to Patient Education Activity  Goal: Patient/Family Education  Outcome: Resolved/Met Date Met:  07/31/15  See education.    Problem: Breathing Pattern - Ineffective  Goal: *Absence of hypoxia  Outcome: Progressing Towards Goal  o2 sat 96-100% this shift on 2l/nc
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
## 2015-07-31 NOTE — Progress Notes (Signed)
Bedside and Verbal shift change report given to Riki RuskJeremy (Cabin crewoncoming nurse) by Gunnar Fusipaula (offgoing nurse). Report included the following information SBAR, Kardex, MAR and Recent Results.
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
## 2015-07-31 NOTE — Progress Notes (Signed)
Pt put neb down after a couple minutes. Says "These breathing txs are making my lungs tight." Pt does not want anymore txs tonight.

## 2015-07-31 NOTE — Progress Notes (Addendum)
Records obtained through care everywhere for several facilities. See chart.

## 2015-07-31 NOTE — Progress Notes (Signed)
RESTING IN BED. ASSESSMENTS COMPLETE.  NO S/S DISTRESS NOTED. SR UP X 2.  CALL LIGHT IN REACH.

## 2015-08-01 ENCOUNTER — Inpatient Hospital Stay: Admit: 2015-08-01 | Payer: MEDICARE

## 2015-08-01 LAB — TSH 3RD GENERATION: TSH: 1.15 u[IU]/mL (ref 0.35–3.74)

## 2015-08-01 LAB — D DIMER: D DIMER: 2.83 ug/ml(FEU) — CR (ref <0.49)

## 2015-08-01 LAB — D-DIMER, QUANTITATIVE: D-Dimer, Quant: 2.83 ug/ml(FEU) (ref <0.49)

## 2015-08-01 LAB — NM MYOCARDIAL SPECT REST EXERCISE OR RX: Left Ventricular Ejection Fraction: 60

## 2015-08-01 MED ORDER — TECHNETIUM TC 99M TETROFOSMIN IV KIT
Freq: Once | Status: AC
Start: 2015-08-01 — End: 2015-08-01
  Administered 2015-08-01: 14:00:00 via INTRAVENOUS

## 2015-08-01 MED ORDER — PREDNISONE 10 MG TAB
10 mg | Freq: Every day | ORAL | Status: DC
Start: 2015-08-01 — End: 2015-08-02
  Administered 2015-08-01 – 2015-08-02 (×2): via ORAL

## 2015-08-01 MED ORDER — AMINOPHYLLINE 500 MG/20 ML IV
500 mg/20 mL | Freq: Once | INTRAVENOUS | Status: AC
Start: 2015-08-01 — End: 2015-08-01
  Administered 2015-08-01: 15:00:00 via INTRAVENOUS

## 2015-08-01 MED ORDER — ENOXAPARIN 40 MG/0.4 ML SUB-Q SYRINGE
40 mg/0.4 mL | Freq: Two times a day (BID) | SUBCUTANEOUS | Status: DC
Start: 2015-08-01 — End: 2015-08-03
  Administered 2015-08-02 – 2015-08-03 (×4): via SUBCUTANEOUS

## 2015-08-01 MED ORDER — SERTRALINE 50 MG TAB
50 mg | Freq: Every day | ORAL | Status: DC
Start: 2015-08-01 — End: 2015-08-03
  Administered 2015-08-01 – 2015-08-03 (×4): via ORAL

## 2015-08-01 MED ORDER — REGADENOSON 0.4 MG/5 ML IV SYRINGE
0.4 mg/5 mL | Freq: Once | INTRAVENOUS | Status: AC
Start: 2015-08-01 — End: 2015-08-01
  Administered 2015-08-01: 14:00:00 via INTRAVENOUS

## 2015-08-01 MED ORDER — OLANZAPINE 5 MG TAB, RAPID DISSOLVE
5 mg | Freq: Every evening | ORAL | Status: DC
Start: 2015-08-01 — End: 2015-08-03
  Administered 2015-08-03 (×2): via ORAL

## 2015-08-01 MED ORDER — TECHNETIUM TC 99M TETROFOSMIN IV KIT
Freq: Once | Status: AC
Start: 2015-08-01 — End: 2015-08-01
  Administered 2015-08-01: 13:00:00 via INTRAVENOUS

## 2015-08-01 MED FILL — LOVENOX 40 MG/0.4 ML SUBCUTANEOUS SYRINGE: 40 mg/0.4 mL | SUBCUTANEOUS | Qty: 0.4

## 2015-08-01 MED FILL — PREDNISONE 10 MG TAB: 10 mg | ORAL | Qty: 4

## 2015-08-01 MED FILL — LEXISCAN 0.4 MG/5 ML INTRAVENOUS SYRINGE: 0.4 mg/5 mL | INTRAVENOUS | Qty: 5

## 2015-08-01 MED FILL — SOLU-MEDROL (PF) 40 MG/ML SOLUTION FOR INJECTION: 40 mg/mL | INTRAMUSCULAR | Qty: 1

## 2015-08-01 MED FILL — AMINOPHYLLINE 500 MG/20 ML IV: 500 mg/20 mL | INTRAVENOUS | Qty: 4

## 2015-08-01 MED FILL — TECHNETIUM TC 99M TETROFOSMIN IV KIT: Qty: 30

## 2015-08-01 MED FILL — OLANZAPINE 5 MG TAB, RAPID DISSOLVE: 5 mg | ORAL | Qty: 1

## 2015-08-01 MED FILL — LORAZEPAM 0.5 MG TAB: 0.5 mg | ORAL | Qty: 1

## 2015-08-01 MED FILL — SERTRALINE 50 MG TAB: 50 mg | ORAL | Qty: 1

## 2015-08-01 MED FILL — FAMOTIDINE 20 MG TAB: 20 mg | ORAL | Qty: 1

## 2015-08-01 MED FILL — TECHNETIUM TC 99M TETROFOSMIN IV KIT: Qty: 10

## 2015-08-01 MED FILL — ALBUTEROL SULFATE 2.5 MG/0.5 ML NEB SOLUTION: 2.5 mg/0.5 mL | RESPIRATORY_TRACT | Qty: 0.5

## 2015-08-01 MED FILL — LEVOTHYROXINE 88 MCG TAB: 88 mcg | ORAL | Qty: 1

## 2015-08-01 MED FILL — ASPIRIN 81 MG CHEWABLE TAB: 81 mg | ORAL | Qty: 1

## 2015-08-01 MED FILL — ATORVASTATIN 10 MG TAB: 10 mg | ORAL | Qty: 2
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
## 2015-08-01 NOTE — Consults (Signed)
Consult Note dictated [phone]

## 2015-08-01 NOTE — Procedures (Signed)
[facility] INDUCED TEST    Name:  [name] [name], [name] [name]  MR #:  [id number]  Account #:  [id number]  Date:  08/01/2015     Room:  305  Physician:  Woodward Ku A  Cardiology - GXT - DRUG INDUCED TEST    Reason for GXT:     Chest pain   Medications:   In Epic   Allergies:     NO KNOWN DRUG ALLERGIES   INTERPRETATION OF STRESS TEST:  Protocol:      Lexiscan  Age: 64Y  Height:   42"  Weight:   89  Sex: Female    Race:         * * * LEXISCAN 0.4  MG ADMIN. * * *    BLOOD PRESSURE      HEART RATE               MET  REST:  100/60                 REST:   100                1 MIN: 88/40                  137   2 MIN: 91/60                  134  3 MIN: 117/85                      128   MIN:                       MIN:                       PEAK:                    PEAK:      * * * POST EXERCISE RESPONSE * * *    BLOOD PRESSURE      HEART RATE  1 MIN: 102/60                 127   3 MIN: 98/60                  117   MIN:                       MIN:                           HEART - - PREDICTED MHR:      MHR ACHIEVED:   %   PEAK HR ACHIEVED:   TOTAL TIME:         TARGET (85% MHR):        STAGE:         TIME:    SYMPTOMS/REASON FOR STOPPING:     The patient had shortness of  breath but no chest pain.    PRE-EXERCISE ECG:   Sinus tachycardia, normal axis, no acute ST/T  wave changes.    EKG RESPONSE:      No significant ST segment changes noted.    No  arrhythmia.    PEAK ACTIVITY LEVEL:       METS.   MAXIMUM 02 CONSUMPTION (VO2):  INDEX OF CARDIAC WORK:        FITNESS CLASSIFICATION:       INTERPRETATION:    1. Negative electrocardiographic test in response to Lexiscan.     2. Myoview distribution report is pending.       Patient education completed.         MAF:acw  DD: 08/01/2015 00:00:00  DT:  08/01/2015 17:11:22  CC:

## 2015-08-01 NOTE — Progress Notes (Signed)
Patient OOR for testing. Will check back at a later time.
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
## 2015-08-01 NOTE — Progress Notes (Signed)
Occupational Therapy   Hospital Initial Evaluation:    NAME: [name] [name] AGE: 55 y.o.  GENDER: female  DATE: 08/01/2015  PRIMARY DIAGNOSIS: COPD exacerbation (HCC)  Chest pain  COPD (chronic obstructive pulmonary disease) (HCC)  Acute exacerbation of chronic obstructive pulmonary disease (COPD) (HCC)    Orders received, chart reviewed, and initial evaluation completed.    PLOF: per chart review-patient lives in her vehicle    Patient presents at an overall level of assistance of modified independent with basic functional mobility. Results of the evaluation consist of:   BUE AROM WFL, BUE MMS 4+ to 5-/5  Alert, oriented x 4  Balance  Sitting: Intact  Standing: Intact      Functional Transfers  Sit to Stand: Modified independent  Stand to Sit: Modified independent  Bed to Chair: Independent      Basic ADL  Feeding: Independent  Oral Facial Hygiene/Grooming: Independent  Bathing: Modified independent  Upper Body Dressing: Modified independent  Lower Body Dressing: Modified independent  Toileting: Independent      Coordination  Fine Motor Skills-Upper: Left Intact, Right Intact  Gross Motor Skills-Upper: Left Intact, Right Intact  Pain: "I hurt all over"    Based on this evaluation, the patient would not benefit from acute care occupational therapy services as patient is currently functioning at a level of independent/modified independent with self care and functional mobility. Patient [facility], however, patient c/o abscess on her buttocks that she says nursing is not aware of. This therapist notified nursing of patient c/o.     Treatment Plan: Evaluation only    The plan of care has been discussed with the patient.     Thank you for this consult.  Therapist: Delsa GranaAmber D Schweickart, OTR/L, CLT        Occupational Therapist, Certified Lymphedema Therapist
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
## 2015-08-01 NOTE — Progress Notes (Signed)
Please see my consult note on this patient.     [name] LoryAyobola Tashari Schoenfelder, MD

## 2015-08-01 NOTE — Progress Notes (Signed)
Problem: Falls - Risk of  Goal: *Absence of falls  Patient in new environment and at risk for a fall. Patient will not fall through 08/02/15   Outcome: Progressing Towards Goal  Pt has had no falls this shift.  Goal: *Knowledge of fall prevention  Patient will be reminded to use call light for assistance through 08/02/15.   Outcome: Progressing Towards Goal  Pt reminded to use call light this shift for assistance.    Problem: Nutrition Deficit  Goal: *Optimize nutritional status  Patient w/ malnutrition. Patient will have a weight gain by 08/02/15.   Outcome: Progressing Towards Goal  Pt did not show weight gain this shift, however she was NPO since midnight for stress test today.    Problem: Pressure Injury - Risk of  Goal: *Prevention of pressure ulcer  Patient with malnutrition making patient at risk for pressure ulcer development. Patient will not develop a pressure ulcer through 08/02/15.   Outcome: Progressing Towards Goal  No pressure ulcer development this shift.    Problem: Breathing Pattern - Ineffective  Goal: *Absence of hypoxia  Outcome: Progressing Towards Goal  Pt O2 Sat 97% this shift.

## 2015-08-01 NOTE — Progress Notes (Signed)
CM following for discharge needs and possible rehab placement.  CM will send referral for rehab once patient is medically stable and more psychiatrically stable.

## 2015-08-01 NOTE — Progress Notes (Signed)
0650 Lying in bed awake. Alert and oriented at present. Patient educated on today's plan of care. Pt aware of NPO status. Respirations even and unlabored. No concerns voiced at this time. Initial shift assessment complete. Side rails up x 2. Call light in place and advised to call for assistance. Will continue to monitor.

## 2015-08-01 NOTE — Progress Notes (Signed)
Initial/Spiritual Assessment, patient floor    Pt resting in bed. No family present at bedside. Pt alert and oriented. Chaplain cultivated a relationship of care and support. Chaplain facilitated storytelling and listened reflectively. Chaplain explored pt's spiritual needs and resources. Pt expressed strong feelings of anger regarding several situations in her life. Pt reflected on how these events have impacted her life. Chaplain listened empathically and reviewed pt's coping strategies. Chaplain explained Spiritual Care Services, offered empathy, and assured pt of continued support.

## 2015-08-01 NOTE — Progress Notes (Addendum)
Internal Medicine Daily Progress Note    Patient: [name] [name]  Sex: female MRN: [id number]    Date of Birth:  1960/09/09 Age:  55 y.o.                    PCP: None    Treatment Team: Attending Provider: Rexford Maus, MD; Consulting Provider: Conard Novak, MD; Care Manager: Cordie Grice, RN    Subjective:     Breathing well, paranoid, no vomiting, no further chest pain, no fever    Objective:   Physical Exam:  Visit Vitals   ??? BP 119/67 (BP 1 Location: Left arm, BP Patient Position: At rest)   ??? Pulse 96   ??? Temp 97.6 ??F (36.4 ??C)   ??? Resp 20   ??? Ht  (1.702 m)   ??? Wt 38.1 kg (84 lb 1.6 oz)   ??? SpO2 98%   ??? BMI 13.17 kg/m2      Temp (24hrs), Avg:97.8 ??F (36.6 ??C), Min:97.6 ??F (36.4 ??C), Max:98 ??F (36.7 ??C)    Oxygen Therapy  O2 Sat (%): 98 % (08/01/15 0715)  Pulse via Oximetry: 87 beats per minute (07/31/15 1929)  O2 Device: Nasal cannula (08/01/15 0715)  O2 Flow Rate (L/min): 2 l/min (08/01/15 0715)    Intake/Output Summary (Last 24 hours) at 08/01/15 1052  Last data filed at 08/01/15 0935   Gross per 24 hour   Intake              140 ml   Output             2400 ml   Net            -2260 ml   General: No acute distress, with temporal muscle wasting  HEENT: Atraumatic without icterus  Neck:  Supple without adenopathy  Lungs: Decreased breath sounds on both lung fields   Heart: Regular rate and rhythm,?? No murmur, rub, or gallop  Abdomen: Soft, Non distended, Non tender, Positive bowel sounds  Extremities: No cyanosis, clubbing or edema  Neurologic:?? No focal deficits  Skin:  No rashes   Mental Status: Alert, Ox3  Psych: Looks anxious, has paranoia    Lab:  CMP:   No results found for: NA, K, CL, CO2, AGAP, GLU, BUN, CREA, GFRAA, GFRNA, CA, MG, PHOS, ALB, TBIL, TP, ALB, GLOB, AGRAT, SGOT, ALT, GPT  CBC:   No results found for: WBC, HGB, HGBEXT, HCT, HCTEXT, PLT, PLTEXT, HGBEXT, HCTEXT, PLTEXT  All Cardiac Markers in the last 24 hours:    No results found for: CPK, CKMMB, CKMB, RCK3, CKMBT, CKNDX, CKND1, MYO, TROPT, TROIQ, TROI, TROPT, TNIPOC, BNP, BNPP  ?? Radiology films personally reviewed  ?? Medications reviewed    Assessment/Plan     Principal Problem:    Acute exacerbation of chronic obstructive pulmonary disease (COPD) (HCC) (07/30/2015)  Continue solumedrol and duoneb nebulizer    Active Problems:    Homeless single person (07/29/2015)  Case manager consulted. At this time, patient seems to be driving aimlessless and has no definite plan on where she will be going. I discussed with patient about placement and she is agreeable. If able to get placement, she will go to a facility, if she does not qualify, she agreed to go and she will be going to another undetermined place      Chest pain (07/29/2015)  For stress test, asa, lipid panel      Physical deconditioning (07/30/2015)  Consult  PT OT    Chronic respiratory failure with hypoxia (HCC) (07/30/2015)  o2 supplement. Will check with case manager regarding her O2 needs    Paranoia- she also manifest flight of ideas, anxiety  Per discussion with psychiatry, she will be going to behavioral unit once medically stable      Undernutrition (07/30/2015) with low albumin  Consult nutrition    Severe protein calorie malnutrition  Nutrition consulted    Will transfer to behavioral unit when stress test is negative and if D dimer is negative    Addendum: spoke with cardiology earlier and unable to rule out PE. D dimer is elevted. Plan to do CTA chest but unable to get good iv access. Plan to do V/Q instead but she had stress test done today and V/Q scan cannot be done for the next 48 hours. I also discussed about inpatient behavioral unit transfer once medically cleared and she agreed. Still waiting for records from [place]. She mentioned about lung biopsy but I am not clear about this. I will keep her on therapeutic dose of lovenox until PE is ruled out..    2:41 PM

## 2015-08-01 NOTE — Consults (Signed)
Consult Note dictated [id number]

## 2015-08-01 NOTE — Other (Signed)
1. Severe protein-calorie malnutrition in the setting of:    BMI 14.27, cachectic, homeless (living in car), paranoia, "Undernourished",   Dietician documentation of Severely malnourished as evidenced by BMI 14.27, patient-stated wt loss >33# x past year (27.85% decrease), diminished PO intake related to limited income and homelessness, and visible fat and muscle wasting (spine, clavicle, triceps) noted upon nutrition-focused physical examination. PO intake only 33% average x last 3 documented meals on Cardiac diet,       2. Other explanation of clinical findings    3. Unable to determine (no explanation for clinical findings)    The medical record reflects the following clinical findings, treatment, and risk factors.    Clinical Findings: Albumin 3.6 drops to 2.8, TP 7.7 drops to 6.0 (NS bolus given in ER)  Treatment: Monitoring labs, I&O's, daily weight, cardiac diet, IV fluids  Risk Factors: AECOPD, Chronic ARF with hypoxia, NPO for testing,     Please clarify and document your clinical opinion in the progress notes and discharge summary including the definitive and/or presumptive diagnosis, (suspected or probable), related to the above clinical findings. Please include clinical findings supporting your diagnosis.

## 2015-08-01 NOTE — Progress Notes (Signed)
STRESS MYOVIEW WAS COMPLETED 10:17 AM WITH NO COMPLICATIONS BY Reita MayKrista D Onetha Gaffey.    PATIENTS RESTING IMAGE DOSE IS 10.9[facility] MYOVIEW.  PATIENTS STRESS IMAGE DOSE IS 31.4[facility] MYOVIEW.    Dr prasad. WILL READ STRESS TEST.

## 2015-08-01 NOTE — Procedures (Signed)
[facility]  CARDIOLOGY - GXT - DRUG INDUCED TEST    Name:  [name], [name]  MR #:  [id number]  Account #:  [id number]  Date:  08/01/2015     Room:  305  Physician:  PUNZAL, EMILIO A  Cardiology - GXT - DRUG INDUCED TEST    Reason for GXT:     Chest pain   Medications:   In Epic   Allergies:     NO KNOWN DRUG ALLERGIES   INTERPRETATION OF STRESS TEST:  Protocol:      Lexiscan  Age: 54Y  Height:   67"  Weight:   85  Sex: Female    Race:         * * * LEXISCAN 0.4  MG ADMIN. * * *    BLOOD PRESSURE      HEART RATE               MET  REST:  100/60                 REST:   100                1 MIN: 88/40                  137   2 MIN: 91/60                  134  3 MIN: 117/85                      128   MIN:                       MIN:                       PEAK:                    PEAK:      * * * POST EXERCISE RESPONSE * * *    BLOOD PRESSURE      HEART RATE  1 MIN: 102/60                 127   3 MIN: 98/60                  117   MIN:                       MIN:                           HEART - - PREDICTED MHR:      MHR ACHIEVED:   %   PEAK HR ACHIEVED:   TOTAL TIME:         TARGET (85% MHR):        STAGE:         TIME:    SYMPTOMS/REASON FOR STOPPING:     The patient had shortness of  breath but no chest pain.    PRE-EXERCISE ECG:   Sinus tachycardia, normal axis, no acute ST/T  wave changes.    EKG RESPONSE:      No significant ST segment changes noted.    No  arrhythmia.    PEAK ACTIVITY LEVEL:       METS.   MAXIMUM 02 CONSUMPTION (VO2):      INDEX OF CARDIAC WORK:        FITNESS CLASSIFICATION:       INTERPRETATION:    1. Negative electrocardiographic test in response to Lexiscan.     2. Myoview distribution report is pending.       Patient education completed.         MAF:acw  DD: 08/01/2015 00:00:00  DT:  08/01/2015 17:11:22  CC:

## 2015-08-02 ENCOUNTER — Inpatient Hospital Stay: Admit: 2015-08-02 | Payer: MEDICARE

## 2015-08-02 LAB — CBC WITH AUTOMATED DIFF
ABS. BASOPHILS: 0 10*3/uL (ref 0.0–0.1)
ABS. EOSINOPHILS: 0 10*3/uL (ref 0.0–0.5)
ABS. LYMPHOCYTES: 0.9 10*3/uL (ref 0.8–3.5)
ABS. MONOCYTES: 0.1 10*3/uL — ABNORMAL LOW (ref 0.8–3.5)
ABS. NEUTROPHILS: 9.9 10*3/uL — ABNORMAL HIGH (ref 1.5–8.0)
BASOPHILS: 0 % (ref 0–2)
EOSINOPHILS: 0 % (ref 0–5)
HCT: 36.4 % — ABNORMAL LOW (ref 41–53)
HGB: 10.4 g/dL — ABNORMAL LOW (ref 12.0–16.0)
LYMPHOCYTES: 8 % — ABNORMAL LOW (ref 19–48)
MCH: 19.4 PG — ABNORMAL LOW (ref 27–31)
MCHC: 28.6 g/dL — ABNORMAL LOW (ref 31–37)
MCV: 68 FL — ABNORMAL LOW (ref 80–100)
MONOCYTES: 1 % — ABNORMAL LOW (ref 3–9)
MPV: 9.3 FL (ref 5.9–10.3)
NEUTROPHILS: 91 % — ABNORMAL HIGH (ref 40–74)
PLATELET: 352 10*3/uL (ref 130–400)
RBC: 5.35 M/uL (ref 4.2–5.4)
RDW: 16.8 % — ABNORMAL HIGH (ref 11.5–14.5)
WBC: 10.9 10*3/uL — ABNORMAL HIGH (ref 4.5–10.8)

## 2015-08-02 MED ORDER — IOPAMIDOL 76 % IV SOLN
370 mg iodine /mL (76 %) | Freq: Once | INTRAVENOUS | Status: AC
Start: 2015-08-02 — End: 2015-08-02
  Administered 2015-08-02: 21:00:00 via INTRAVENOUS

## 2015-08-02 MED ORDER — PREDNISONE 10 MG TAB
10 mg | Freq: Every day | ORAL | Status: DC
Start: 2015-08-02 — End: 2015-08-03
  Administered 2015-08-03: 12:00:00 via ORAL

## 2015-08-02 MED FILL — ALBUTEROL SULFATE 2.5 MG/0.5 ML NEB SOLUTION: 2.5 mg/0.5 mL | RESPIRATORY_TRACT | Qty: 0.5

## 2015-08-02 MED FILL — OLANZAPINE 5 MG TAB, RAPID DISSOLVE: 5 mg | ORAL | Qty: 1

## 2015-08-02 MED FILL — PREDNISONE 10 MG TAB: 10 mg | ORAL | Qty: 4

## 2015-08-02 MED FILL — ASPIRIN 81 MG CHEWABLE TAB: 81 mg | ORAL | Qty: 1

## 2015-08-02 MED FILL — ATORVASTATIN 10 MG TAB: 10 mg | ORAL | Qty: 2

## 2015-08-02 MED FILL — FAMOTIDINE 20 MG TAB: 20 mg | ORAL | Qty: 1

## 2015-08-02 MED FILL — ISOVUE-370  76 % INTRAVENOUS SOLUTION: 370 mg iodine /mL (76 %) | INTRAVENOUS | Qty: 100

## 2015-08-02 MED FILL — LOVENOX 40 MG/0.4 ML SUBCUTANEOUS SYRINGE: 40 mg/0.4 mL | SUBCUTANEOUS | Qty: 0.4

## 2015-08-02 MED FILL — SERTRALINE 50 MG TAB: 50 mg | ORAL | Qty: 1

## 2015-08-02 MED FILL — LEVOTHYROXINE 88 MCG TAB: 88 mcg | ORAL | Qty: 1

## 2015-08-02 NOTE — Progress Notes (Addendum)
Psychiatry Progress Note    Date: 08/02/2015  Account Number:  [id number]  Name: [name] [name]      Subjective:     Patient seen. She is paranoid about her Zyprexa but she said depression is better. She said Zoloft is helping. She is feeling hopeful today. She denies hallucination. She denies suicidal thought. She denies homicidal thought. She denies side effect to her medication.     Patient Active Problem List    Diagnosis Date Noted   ??? Severe protein-calorie malnutrition (HCC) 08/01/2015   ??? Chronic respiratory failure with hypercapnia (HCC) 07/31/2015   ??? Paranoia (HCC) 07/31/2015   ??? Acute exacerbation of chronic obstructive pulmonary disease (COPD) (HCC) 07/30/2015   ??? Physical deconditioning 07/30/2015   ??? Chronic respiratory failure with hypoxia (HCC) 07/30/2015   ??? Homeless single person 07/29/2015   ??? Chest pain 07/29/2015     Past Surgical History:   Procedure Laterality Date   ??? HX APPENDECTOMY     ??? HX HEENT     ??? HX ORTHOPAEDIC      rt hip tumor removed      No Known Allergies   Social History   Substance Use Topics   ??? Smoking status: Former Smoker   ??? Smokeless tobacco: Never Used   ??? Alcohol use No      History reviewed. No pertinent family history.   None        Objective:         Patient Vitals for the past 8 hrs:   BP Temp Pulse Resp SpO2   08/02/15 1624 - - - - 98 %   08/02/15 1515 122/78 98 ??F (36.7 ??C) (!) 110 16 98 %         Mental Status exam:     Pt appears stated age, kempt and casually dressed. Fair eye contact.  Psychomotor neutral. AAO X 3. Fair memory, concentration & attention span  Speech is rapid. Normal rate, volume and quantity  Mood is elevated and iritable and affect is intense  TP is goal directed. TC She is paranoid about her Zyprexa. She refused it yesterday No suicidal/No homicidal ideation, intent or plan  Perception, no AVH. Average intelligence and fund of knowledge  Impulse control is poor, Insight and judgement is limited.      Therapy notes nursing notes and labs in the past 24 hours reviewed and appreciated.    Assessment/Plan:   Principal Problem:    Acute exacerbation of chronic obstructive pulmonary disease (COPD) (HCC) (07/30/2015)    Active Problems:    Homeless single person (07/29/2015)      Chest pain (07/29/2015)      Physical deconditioning (07/30/2015)      Chronic respiratory failure with hypoxia (HCC) (07/30/2015)      Chronic respiratory failure with hypercapnia (HCC) (07/31/2015)      Paranoia (HCC) (07/31/2015)      Severe protein-calorie malnutrition (HCC) (08/01/2015)          Assessment    Patient still hypomanic. Depression is better. She is feeling hopeful. She is less paranoid today. She is tolerating Zoloft. She denies side effect to her medication.     Changes In Treatment Plan, Potential Side Effect and Benefit and alternatives discussed.    The following information was reviewed and discussed:     The risks and benefits of the proposed medication   patient given opportunity to ask questions   off label use of an approved drug/prescription discussed with  patient     Recommendation.    Zyprexa po 5 mg now then qhs from tomorrow  Continue Zoloft  Psychiatry will continue to follow patient on consult  Call psychiatry MD on call as needed.     Medications:    Current Facility-Administered Medications   Medication Dose Route Frequency   ??? [START ON 08/03/2015] predniSONE (DELTASONE) tablet 30 mg  30 mg Oral DAILY WITH BREAKFAST   ??? enoxaparin (LOVENOX) injection 40 mg  1 mg/kg SubCUTAneous Q12H   ??? sertraline (ZOLOFT) tablet 25 mg  25 mg Oral DAILY   ??? OLANZapine (ZyPREXA zydis) disintegrating tablet 5 mg  5 mg Oral QHS   ??? famotidine (PEPCID) tablet 20 mg  20 mg Oral BID   ??? atorvastatin (LIPITOR) tablet 20 mg  20 mg Oral QHS   ??? aspirin chewable tablet 81 mg  81 mg Oral DAILY   ??? LORazepam (ATIVAN) tablet 0.5 mg  0.5 mg Oral BID PRN   ??? fluticasone-vilanterol (BREO ELLIPTA) 16800mcg-25mcg/puff  1 Puff Inhalation DAILY    ??? levothyroxine (SYNTHROID) tablet 88 mcg  88 mcg Oral ACB   ??? tiotropium (SPIRIVA) inhalation capsule 18 mcg  1 Cap Inhalation DAILY   ??? sodium chloride (NS) flush 5-10 mL  5-10 mL IntraVENous Q8H   ??? sodium chloride (NS) flush 5-10 mL  5-10 mL IntraVENous PRN   ??? albuterol/ipratropium (DUONEB) neb solution  1 Dose Nebulization Q4H RT   ??? acetaminophen (TYLENOL) tablet 650 mg  650 mg Oral Q6H PRN   ??? sodium chloride (OCEAN) 0.65 % nasal spray 2 Spray  2 Spray Both Nostrils Q2H PRN           Signed By: Conard NovakAyobola A Hennie Gosa, MD
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
## 2015-08-02 NOTE — Progress Notes (Signed)
Internal Medicine Daily Progress Note    Patient: [name] [name]  Sex: female MRN: [id number]    Date of Birth:  19-Nov-1960 Age:  55 y.o.                    PCP: None    Treatment Team: Attending Provider: Rexford Maus, MD; Consulting Provider: Conard Novak, MD; Care Manager: Cordie Grice, RN    Subjective:     Breathing well, paranoid, no vomiting, no further chest pain, no fever, she said she is breathing better today.    Objective:   Physical Exam:  Visit Vitals   ??? BP 113/66 (BP 1 Location: Left arm, BP Patient Position: At rest)   ??? Pulse 100   ??? Temp 98 ??F (36.7 ??C)   ??? Resp 16   ??? Ht  (1.702 m)   ??? Wt 38.3 kg (84 lb 6.4 oz)   ??? SpO2 98%   ??? BMI 13.22 kg/m2      Temp (24hrs), Avg:97.9 ??F (36.6 ??C), Min:97.8 ??F (36.6 ??C), Max:98.2 ??F (36.8 ??C)    Oxygen Therapy  O2 Sat (%): 98 % (08/02/15 1151)  Pulse via Oximetry: 98 beats per minute (08/02/15 1151)  O2 Device: Nasal cannula (08/02/15 1151)  O2 Flow Rate (L/min): 2 l/min (08/02/15 1151)    Intake/Output Summary (Last 24 hours) at 08/02/15 1427  Last data filed at 08/02/15 1302   Gross per 24 hour   Intake              660 ml   Output              850 ml   Net             -190 ml   General: No acute distress, with temporal muscle wasting  HEENT: Atraumatic without icterus  Neck:  Supple without adenopathy  Lungs: Decreased breath sounds on both lung fields   Heart: Regular rate and rhythm,?? No murmur, rub, or gallop  Abdomen: Soft, Non distended, Non tender, Positive bowel sounds  Extremities: No cyanosis, clubbing or edema  Neurologic:?? No focal deficits  Skin:  No rashes   Mental Status: Alert, Ox3  Psych: Looks anxious, has paranoia    Lab:  CMP:   No results found for: NA, K, CL, CO2, AGAP, GLU, BUN, CREA, GFRAA, GFRNA, CA, MG, PHOS, ALB, TBIL, TP, ALB, GLOB, AGRAT, SGOT, ALT, GPT  CBC:   No results found for: WBC, HGB, HGBEXT, HCT, HCTEXT, PLT, PLTEXT, HGBEXT, HCTEXT, PLTEXT  All Cardiac Markers in the last 24 hours:    No results found for: CPK, CKMMB, CKMB, RCK3, CKMBT, CKNDX, CKND1, MYO, TROPT, TROIQ, TROI, TROPT, TNIPOC, BNP, BNPP  ?? Radiology films personally reviewed  ?? Medications reviewed    Assessment/Plan     Principal Problem:    Acute exacerbation of chronic obstructive pulmonary disease (COPD) (HCC) (07/30/2015)  Decrease po prednisone and duoneb nebulizer    Active Problems:    Homeless single person (07/29/2015)  Still unclear where she plans to go when discharged      Chest pain (07/29/2015)  Atypical. Stress test did not show ischemia  V/Q scan tomorrow      Physical deconditioning (07/30/2015)  Consult PT OT    Chronic respiratory failure with hypoxia (HCC) (07/30/2015)  o2 supplement. Will check with case manager regarding her O2 needs    Paranoia- she also manifest flight of ideas, anxiety  Per discussion  with psychiatry, she will be going to behavioral unit once medically stable      Undernutrition (07/30/2015) with low albumin  Consult nutrition    Severe protein calorie malnutrition  Nutrition consulted    I received voluminous chart from [place] that stated she had a suspicious lung mass last May 2017 for which she underwent bronchoscopy and that was negative. She had PET scan done and results are unknown. There was a note from pulm in [place] that states she is high risk for surgical intervention if she has lung cancer. She had been seen at different medical centers when she was in [place] and case managers note that they were unable to get her services because she is homeless. Will get CT scan of chest with iv contrast and if abnormal, may need to consult pulmonary. She is scheduled for V/Q scan tomorrow. On empiric therapeutic dose of lovenox for now

## 2015-08-02 NOTE — Progress Notes (Addendum)
[id number]  Rn shift assessment completed, pt instructed on today's plan of care. Pt needs, questions and concerns addressed. Safety measures and fall prevention instituted. Pt resting quietly with call light in hand. Pt still appears anxious this am.       1800 Bedside and Verbal shift change report given to Thayer Ohmhris (Cabin crewoncoming nurse) by Lynford HumphreyShanda (offgoing nurse). Report included the following information SBAR, Kardex and MAR.

## 2015-08-02 NOTE — Other (Signed)
Patient assessment complete. Patient in bed ,locked and lowest position, side rails up x2, call light within reach of the patient. Patient in no acute s/s of distress. No voiced concerns at this time. Patient reminded to ask/call out for assistance when needed. Will continue to monitor.

## 2015-08-03 ENCOUNTER — Inpatient Hospital Stay: Admit: 2015-08-03 | Payer: MEDICARE

## 2015-08-03 ENCOUNTER — Inpatient Hospital Stay

## 2015-08-03 LAB — METABOLIC PANEL, BASIC
Anion gap: 4 mmol/L — ABNORMAL LOW (ref 6–15)
BUN/Creatinine ratio: 51 — ABNORMAL HIGH (ref 7–25)
BUN: 20 MG/DL — ABNORMAL HIGH (ref 7–18)
CO2: 34 mmol/L — ABNORMAL HIGH (ref 21–32)
Calcium: 8.3 MG/DL — ABNORMAL LOW (ref 8.5–10.1)
Chloride: 105 mmol/L (ref 98–107)
Creatinine: 0.39 MG/DL — ABNORMAL LOW (ref 0.60–1.30)
GFR est AA: >60 mL/min/{1.73_m2} (ref >60)
GFR est non-AA: >60 mL/min/{1.73_m2} (ref >60)
Glucose: 99 mg/dL (ref 70–110)
Potassium: 4 mmol/L (ref 3.5–5.3)
Sodium: 143 mmol/L (ref 136–145)

## 2015-08-03 LAB — CBC WITH AUTOMATED DIFF
ABS. EOSINOPHILS: 0.1 10*3/uL (ref 0.0–0.5)
ABS. LYMPHOCYTES: 2.5 10*3/uL (ref 0.8–3.5)
ABS. MONOCYTES: 0.7 10*3/uL — ABNORMAL LOW (ref 0.8–3.5)
ABS. NEUTROPHILS: 4.9 10*3/uL (ref 1.5–8.0)
EOSINOPHILS: 1 % (ref 0–5)
HCT: 34.7 % — ABNORMAL LOW (ref 41–53)
HGB: 9.7 g/dL — ABNORMAL LOW (ref 12.0–16.0)
LYMPHOCYTES: 31 % (ref 19–48)
MCH: 19.5 PG — ABNORMAL LOW (ref 27–31)
MCHC: 28 g/dL — ABNORMAL LOW (ref 31–37)
MCV: 69.8 FL — ABNORMAL LOW (ref 80–100)
MONOCYTES: 8 % (ref 3–9)
MPV: 9.5 FL (ref 5.9–10.3)
NEUTROPHILS: 60 % (ref 40–74)
PLATELET: 359 10*3/uL (ref 130–400)
RBC: 4.97 M/uL (ref 4.2–5.4)
RDW: 17.5 % — ABNORMAL HIGH (ref 11.5–14.5)
WBC: 8.2 10*3/uL (ref 4.5–10.8)

## 2015-08-03 MED ORDER — FAMOTIDINE 20 MG TAB
20 mg | ORAL_TABLET | Freq: Two times a day (BID) | ORAL | 0 refills | Status: AC
Start: 2015-08-03 — End: ?

## 2015-08-03 MED ORDER — OLANZAPINE 5 MG TAB, RAPID DISSOLVE
5 mg | ORAL_TABLET | Freq: Every evening | ORAL | 0 refills | Status: AC
Start: 2015-08-03 — End: ?

## 2015-08-03 MED ORDER — ASPIRIN 81 MG CHEWABLE TAB
81 mg | ORAL_TABLET | Freq: Every day | ORAL | 0 refills | Status: AC
Start: 2015-08-03 — End: ?

## 2015-08-03 MED ORDER — TECHNETIUM PENTETATE
Freq: Once | Status: AC
Start: 2015-08-03 — End: 2015-08-03
  Administered 2015-08-03: 14:00:00 via INTRAVENOUS

## 2015-08-03 MED ORDER — TECHNETIUM TO 99M ALBUMIN AGGREGATED
Freq: Once | Status: AC
Start: 2015-08-03 — End: 2015-08-03
  Administered 2015-08-03: 14:00:00 via INTRAVENOUS

## 2015-08-03 MED ORDER — PREDNISONE 10 MG TAB
10 mg | ORAL_TABLET | ORAL | 0 refills | Status: AC
Start: 2015-08-03 — End: ?

## 2015-08-03 MED ORDER — SERTRALINE 25 MG TAB
25 mg | ORAL_TABLET | Freq: Every day | ORAL | 0 refills | Status: AC
Start: 2015-08-03 — End: ?

## 2015-08-03 MED FILL — LEVOTHYROXINE 88 MCG TAB: 88 mcg | ORAL | Qty: 1

## 2015-08-03 MED FILL — ATORVASTATIN 10 MG TAB: 10 mg | ORAL | Qty: 2

## 2015-08-03 MED FILL — SERTRALINE 50 MG TAB: 50 mg | ORAL | Qty: 1

## 2015-08-03 MED FILL — TECHNETIUM TO 99M ALBUMIN AGGREGATED: Qty: 3

## 2015-08-03 MED FILL — LOVENOX 40 MG/0.4 ML SUBCUTANEOUS SYRINGE: 40 mg/0.4 mL | SUBCUTANEOUS | Qty: 0.4

## 2015-08-03 MED FILL — ALBUTEROL SULFATE 2.5 MG/0.5 ML NEB SOLUTION: 2.5 mg/0.5 mL | RESPIRATORY_TRACT | Qty: 0.5

## 2015-08-03 MED FILL — PREDNISONE 10 MG TAB: 10 mg | ORAL | Qty: 3

## 2015-08-03 MED FILL — TECHNETIUM PENTETATE: Qty: 35

## 2015-08-03 MED FILL — ASPIRIN 81 MG CHEWABLE TAB: 81 mg | ORAL | Qty: 1

## 2015-08-03 MED FILL — OLANZAPINE 5 MG TAB, RAPID DISSOLVE: 5 mg | ORAL | Qty: 1

## 2015-08-03 MED FILL — FAMOTIDINE 20 MG TAB: 20 mg | ORAL | Qty: 1

## 2015-08-03 NOTE — Discharge Summary (Signed)
Discharge Summary      Patient: [name] [name]               Sex: female            MRN: [id number]      Date of Birth:  11/22/1960      Age:  55 y.o.        None            Admit Date: 07/29/2015    Discharge Date: 08/03/2015    Admission Diagnoses: COPD exacerbation (HCC)  Chest pain  COPD (chronic obstructive pulmonary disease) (HCC)  Acute exacerbation of chronic obstructive pulmonary disease (COPD) (HCC)    Discharge Diagnoses:    Problem List as of 08/03/2015  Date Reviewed: 08-03-2015          Codes Class Noted - Resolved    Severe protein-calorie malnutrition (HCC) ICD-10-CM: E43  ICD-9-CM: 262  08/01/2015 - Present        Chronic respiratory failure with hypercapnia (HCC) ICD-10-CM: J96.12  ICD-9-CM: 518.83  07/31/2015 - Present        Paranoia (HCC) ICD-10-CM: F22  ICD-9-CM: 297.1  07/31/2015 - Present        * (Principal)Acute exacerbation of chronic obstructive pulmonary disease (COPD) (HCC) ICD-10-CM: J44.1  ICD-9-CM: 491.21  Aug 03, 2015 - Present        Physical deconditioning ICD-10-CM: R53.81  ICD-9-CM: 799.3  08-03-2015 - Present        Chronic respiratory failure with hypoxia (HCC) ICD-10-CM: J96.11  ICD-9-CM: 518.83, 799.02  Aug 03, 2015 - Present        Homeless single person ICD-10-CM: Z59.0  ICD-9-CM: V60.0  07/29/2015 - Present        Chest pain ICD-10-CM: R07.9  ICD-9-CM: 786.50  07/29/2015 - Present              Discharge Condition: Good    Brief HPI & Hospital Course:     [name] is a 55 y.o. female who presents here with sob that started in the last days, has been living in her car and using the 02 concentrator, has no fevers, no chills no n/v, no cough, otherwise sob improved since being in the ER has no other complains, said used to smoke in the past and exposed to multiple fumes in her job, currently also mentions cp that happened today , says with out 02 can not walk much. She was admitted to telemetry. Serial troponin were done and they were negative. She had stress test that did not show ischemia. She has  acute copd exacerbation and was given duoneb nebulizer and iv solumedrol. D dimer was elevated and V/Q scan was low probility for PE. She was temporarily on full dose lovenox. We obtained records from [place] and she had work up for possible lung mass. I repeated CT chest with contrast that showed right sided 5 m lung nodule and right lung scar. I advised her to follow up with her pulmonologist. She as paranoia. She was seen by psychiatry and she was given zyprexa and zoloft. She was recommended to go to behavioral unit after discharge from acute care but she refused it. She is homeless and requested to be discharged. She has O2 concentrator and unable to get continuos home O2 because she does not have established address. She was advised to go to behavioral so we can get her help but she refused. She is updated on her pneumonia vaccine    Consults: Dr. Armando Gang for psychiatry  Discharge Medications:     Current Discharge Medication List      START taking these medications    Details   aspirin 81 mg chewable tablet Take 1 Tab by mouth daily.  Qty: 30 Tab, Refills: 0      famotidine (PEPCID) 20 mg tablet Take 1 Tab by mouth two (2) times a day.  Qty: 60 Tab, Refills: 0      OLANZapine (ZYPREXA ZYDIS) 5 mg disintegrating tablet Take 1 Tab by mouth nightly.  Qty: 30 Tab, Refills: 0      predniSONE (DELTASONE) 10 mg tablet 3 tabs once a day for 2 days then  2 tabs once a day for 2 days then  1 tab once a day for 2 days then  1/2 tab once a day for 2 days then stop  Qty: 13 Tab, Refills: 0      sertraline (ZOLOFT) 25 mg tablet Take 1 Tab by mouth daily.  Qty: 30 Tab, Refills: 0         CONTINUE these medications which have NOT CHANGED    Details   fluticasone-vilanterol (BREO ELLIPTA) 100-25 mcg/dose inhaler Take 1 Puff by inhalation daily.      sodium chloride (SALINE NASAL) 0.65 % nasal spray 2 Sprays by Both Nostrils route as needed for Congestion. Indications: Nasal Congestion      levothyroxine (SYNTHROID) 88  mcg tablet Take 88 mcg by mouth Daily (before breakfast).      tiotropium (SPIRIVA WITH HANDIHALER) 18 mcg inhalation capsule Take 1 Cap by inhalation daily.      OXYGEN-AIR DELIVERY SYSTEMS 2 L by Does Not Apply route.           Recent Results (from the past 12 hour(s))   METABOLIC PANEL, BASIC    Collection Time: 08/03/15  5:35 AM   Result Value Ref Range    Sodium 143 136 - 145 mmol/L    Potassium 4.0 3.5 - 5.3 mmol/L    Chloride 105 98 - 107 mmol/L    CO2 34 (H) 21 - 32 mmol/L    Anion gap 4 (L) 6 - 15 mmol/L    Glucose 99 70 - 110 mg/dL    BUN 20 (H) 7 - 18 MG/DL    Creatinine 1.61 (L) 0.60 - 1.30 MG/DL    BUN/Creatinine ratio 51 (H) 7 - 25      GFR est AA >60 >60 ml/min/1.12m2    GFR est non-AA >60 >60 ml/min/1.61m2    Calcium 8.3 (L) 8.5 - 10.1 MG/DL   CBC WITH AUTOMATED DIFF    Collection Time: 08/03/15  5:35 AM   Result Value Ref Range    WBC 8.2 4.5 - 10.8 K/uL    RBC 4.97 4.2 - 5.4 M/uL    HGB 9.7 (L) 12.0 - 16.0 g/dL    HCT 09.6 (L) 41 - 53 %    MCV 69.8 (L) 80 - 100 FL    MCH 19.5 (L) 27 - 31 PG    MCHC 28.0 (L) 31 - 37 g/dL    RDW 04.5 (H) 40.9 - 14.5 %    PLATELET 359 130 - 400 K/uL    MPV 9.5 5.9 - 10.3 FL    NEUTROPHILS 60 40 - 74 %    LYMPHOCYTES 31 19 - 48 %    MONOCYTES 8 3 - 9 %    EOSINOPHILS 1 0 - 5 %    ABS. NEUTROPHILS 4.9 1.5 - 8.0 K/UL  ABS. LYMPHOCYTES 2.5 0.8 - 3.5 K/UL    ABS. MONOCYTES 0.7 (L) 0.8 - 3.5 K/UL    ABS. EOSINOPHILS 0.1 0.0 - 0.5 K/UL    DF AUTOMATED      RBC COMMENTS 1+  HYPOCHROMIA       RBC COMMENTS 1+  ANISOCYTOSIS       RBC COMMENTS 1+  MICROCYTOSIS          Activity: Activity as tolerated    Diet: Regular Diet    Wound Care: None needed    Follow-up:     Follow-up with primary care provider in 1 week  You may need repeat CT scan of chest in next 3-4 months  With your pulmonologist  With behavioral unit as scheduled by intake coordinator       ROS:    Feeling well. No fever, breathing better, no chest pain, no vomiting    OBJECTIVE:   The patient appears well, alert,  in no distress.   Visit Vitals   ??? BP 130/73 (BP 1 Location: Left arm, BP Patient Position: At rest)   ??? Pulse 97   ??? Temp 97.9 ??F (36.6 ??C)   ??? Resp 17   ??? Ht 5\' 7"  (1.702 m)   ??? Wt 38.3 kg (84 lb 6.4 oz)   ??? SpO2 100%   ??? BMI 13.22 kg/m2     ENT normal.  Neck supple. No adenopathy or thyromegaly. PERLA.   Lungs are clear, good air entry.   Cardiovascular:S1 and S2.   Abdomen is soft without tenderness, guarding, mass or organomegaly.     Neurological non focal       Total time spent in discharge day management: 40 minutes

## 2015-08-03 NOTE — Other (Signed)
Waiting on placement for psych follow up. Trying to set up prior to discharge,.

## 2015-08-03 NOTE — Other (Signed)
Bedside and Verbal shift change report given to Shanda (oncoming nurse) by Chris (offgoing nurse). Report included the following information SBAR, Kardex, Intake/Output, MAR and Recent Results.

## 2015-08-03 NOTE — Progress Notes (Signed)
Problem: Falls - Risk of  Goal: *Absence of falls  Patient in new environment and at risk for a fall. Patient will not fall through 08/04/15   Outcome: Progressing Towards Goal  No falls this shift  Goal: *Knowledge of fall prevention  Patient will be reminded to use call light for assistance through 08/04/15.   Outcome: Progressing Towards Goal  Using call light properly this shift    Problem: Nutrition Deficit  Goal: *Optimize nutritional status  Patient w/ malnutrition. Patient will have a weight gain by 08/04/15.   Outcome: Progressing Towards Goal  Variance: Patient slowly responding        Problem: Pressure Injury - Risk of  Goal: *Prevention of pressure ulcer  Patient with malnutrition making patient at risk for pressure ulcer development. Patient will not develop a pressure ulcer through 08/04/15.   Outcome: Progressing Towards Goal  No s/s of skin breakdown this shift    Problem: Breathing Pattern - Ineffective  Goal: *Absence of hypoxia  Outcome: Progressing Towards Goal  Visit Vitals   ??? BP 104/51 (BP 1 Location: Right arm, BP Patient Position: At rest)   ??? Pulse 95   ??? Temp 97.7 ??F (36.5 ??C)   ??? Resp 16   ??? Ht 5\' 7"  (1.702 m)   ??? Wt 38.3 kg (84 lb 6.4 oz)   ??? SpO2 97%   ??? BMI 13.22 kg/m2           Problem: Nutrition Deficit  Goal: *Optimize nutritional status  Pt will consume prescribed diet by 08/04/15   Outcome: Progressing Towards Goal  75% of previous meal
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
## 2015-08-03 NOTE — Other (Signed)
Per Intake coordinator, patient can be discharged to home if does not want to [facility]. Intake discussed with Dr Val Eagle. I discussed option with patient she states she does not [facility] to [facility] [address]. She wants to be discharged.  Informed discharge nurse.

## 2015-08-03 NOTE — Progress Notes (Signed)
VENTILATION/PERFUSION SCAN WAS COMPLETED 10:12 AM WITH NO COMPLICATIONS BY Vertis KelchJoseph Rimmer.    PATIENT WAS GIVEN 6.0MCI MAA.  PATIENT WAS GIVEN 34.1MCI DTPA IN AEROSOL FORM.

## 2015-08-03 NOTE — Progress Notes (Signed)
Internal Medicine Daily Progress Note    Patient: [name] NossLynda Fullwood  Sex: female MRN: [id number]    Date of Birth:  24-Sep-1960 Age:  55 y.o.                    PCP: None    Treatment Team: Attending Provider: Rexford MausEmilio A Kazaria Gaertner, MD; Consulting Provider: Conard NovakAyobola A Oloworaran, MD; Care Manager: Cordie GriceStephanie D Miller, RN    Subjective:     Breathing well, paranoid, no vomiting, no further chest pain, no fever, she said she is breathing better today. No acute complaints except getting her O2 needs    Objective:   Physical Exam:  Visit Vitals   ??? BP 90/50 (BP 1 Location: Left arm, BP Patient Position: At rest)   ??? Pulse 68   ??? Temp 97.6 ??F (36.4 ??C)   ??? Resp 16   ??? Ht 5\' 7"  (1.702 m)   ??? Wt 38.3 kg (84 lb 6.4 oz)   ??? SpO2 100%   ??? BMI 13.22 kg/m2      Temp (24hrs), Avg:97.9 ??F (36.6 ??C), Min:97.6 ??F (36.4 ??C), Max:98 ??F (36.7 ??C)    Oxygen Therapy  O2 Sat (%): 100 % (08/03/15 0710)  Pulse via Oximetry: 108 beats per minute (08/02/15 1624)  O2 Device: Nasal cannula (08/03/15 0710)  O2 Flow Rate (L/min): 2 l/min (08/03/15 0710)    Intake/Output Summary (Last 24 hours) at 08/03/15 1045  Last data filed at 08/03/15 0801   Gross per 24 hour   Intake              720 ml   Output              550 ml   Net              170 ml   General: No acute distress, with temporal muscle wasting  HEENT: Atraumatic without icterus  Neck:  Supple without adenopathy  Lungs: Decreased breath sounds on both lung fields   Heart: Regular rate and rhythm,?? No murmur, rub, or gallop  Abdomen: Soft, Non distended, Non tender, Positive bowel sounds  Extremities: No cyanosis, clubbing or edema  Neurologic:?? No focal deficits  Skin:  No rashes   Mental Status: Alert, Ox3  Psych: Looks anxious, has paranoia    Lab:  CMP:   Lab Results   Component Value Date/Time    NA 143 08/03/2015 05:35 AM    K 4.0 08/03/2015 05:35 AM    CL 105 08/03/2015 05:35 AM    CO2 34 (H) 08/03/2015 05:35 AM    AGAP 4 (L) 08/03/2015 05:35 AM    GLU 99 08/03/2015 05:35 AM     BUN 20 (H) 08/03/2015 05:35 AM    CREA 0.39 (L) 08/03/2015 05:35 AM    GFRAA >60 08/03/2015 05:35 AM    GFRNA >60 08/03/2015 05:35 AM    CA 8.3 (L) 08/03/2015 05:35 AM     CBC:   Lab Results   Component Value Date/Time    WBC 8.2 08/03/2015 05:35 AM    HGB 9.7 (L) 08/03/2015 05:35 AM    HCT 34.7 (L) 08/03/2015 05:35 AM    PLT 359 08/03/2015 05:35 AM     All Cardiac Markers in the last 24 hours:   No results found for: CPK, CKMMB, CKMB, RCK3, CKMBT, CKNDX, CKND1, MYO, TROPT, TROIQ, TROI, TROPT, TNIPOC, BNP, BNPP  ?? Radiology films personally reviewed  ?? Medications reviewed  CT chest: +  5 mm lung nodule, right lung scarring  V/Q scan: low probability for PE  Assessment/Plan     Principal Problem:    Acute exacerbation of chronic obstructive pulmonary disease (COPD) (HCC) (07/30/2015)  Decrease po prednisone and duoneb nebulizer    Active Problems:    Homeless single person (07/29/2015)  Still unclear where she plans to go when discharged      Chest pain (07/29/2015)  Atypical. Stress test did not show ischemia  resolved      Physical deconditioning (07/30/2015)  Consult PT OT    Chronic respiratory failure with hypoxia (HCC) (07/30/2015)  o2 supplement. Will check with case manager regarding her O2 needs    Paranoia- she also manifest flight of ideas, anxiety  Per discussion with psychiatry, she will be going to behavioral unit once medically stable      Undernutrition (07/30/2015) with low albumin  Consult nutrition    Severe protein calorie malnutrition  Nutrition consulted    I spoke with patient and she does not want to go to behavioral unit. She wished to be discharged. She is aware that I cannot arrange for Home O2 unless she stays in a place. I strongly recommend for her to go to behavioral unit to see if there are other help we can give her but she refused. No evidence of malignancy seen on CT chest. I advised to follow up with her pulmonologist in [place]. She is updated on her pneumonia vaccine.

## 2015-08-03 NOTE — Discharge Summary (Signed)
Discharge Summary      Patient: [name] [name]               Sex: female            MRN: [id number]      Date of Birth:  Dec 29, 1960      Age:  55 y.o.        None            Admit Date: 07/29/2015    Discharge Date: 08/03/2015    Admission Diagnoses: COPD exacerbation (HCC)  Chest pain  COPD (chronic obstructive pulmonary disease) (HCC)  Acute exacerbation of chronic obstructive pulmonary disease (COPD) (HCC)    Discharge Diagnoses:    Problem List as of 08/03/2015  Date Reviewed: August 24, 2015          Codes Class Noted - Resolved    Severe protein-calorie malnutrition (HCC) ICD-10-CM: E43  ICD-9-CM: 262  08/01/2015 - Present        Chronic respiratory failure with hypercapnia (HCC) ICD-10-CM: J96.12  ICD-9-CM: 518.83  07/31/2015 - Present        Paranoia (HCC) ICD-10-CM: F22  ICD-9-CM: 297.1  07/31/2015 - Present        * (Principal)Acute exacerbation of chronic obstructive pulmonary disease (COPD) (HCC) ICD-10-CM: J44.1  ICD-9-CM: 491.21  08-24-2015 - Present        Physical deconditioning ICD-10-CM: R53.81  ICD-9-CM: 799.3  24-Aug-2015 - Present        Chronic respiratory failure with hypoxia (HCC) ICD-10-CM: J96.11  ICD-9-CM: 518.83, 799.02  Aug 24, 2015 - Present        Homeless single person ICD-10-CM: Z59.0  ICD-9-CM: V60.0  07/29/2015 - Present        Chest pain ICD-10-CM: R07.9  ICD-9-CM: 786.50  07/29/2015 - Present              Discharge Condition: Good    Brief HPI & Hospital Course:     [name] [name] is a 55 y.o. female who presents here with sob that started in the last days, has been living in her car and using the 02 concentrator, has no fevers, no chills no n/v, no cough, otherwise sob improved since being in the ER has no other complains, said used to smoke in the past and exposed to multiple fumes in her job, currently also mentions cp that happened today , says with out 02 can not walk much. She was admitted to telemetry. Serial troponin were done and they were negative. She had  stress test that did not show ischemia. She has acute copd exacerbation and was given duoneb nebulizer and iv solumedrol. D dimer was elevated and V/Q scan was low probility for PE. She was temporarily on full dose lovenox. We obtained records from [place] and she had work up for possible lung mass. I repeated CT chest with contrast that showed right sided 5 m lung nodule and right lung scar. I advised her to follow up with her pulmonologist. She as paranoia. She was seen by psychiatry and she was given zyprexa and zoloft. She was recommended to go to behavioral unit after discharge from acute care but she refused it. She is homeless and requested to be discharged. She has O2 concentrator and unable to get continuos home O2 because she does not have established address. She was advised to go to behavioral so we can get her help but she refused. She is updated on her pneumonia vaccine    Consults: Dr. Armando Gang for psychiatry  Discharge Medications:     Current Discharge Medication List      START taking these medications    Details   aspirin 81 mg chewable tablet Take 1 Tab by mouth daily.  Qty: 30 Tab, Refills: 0      famotidine (PEPCID) 20 mg tablet Take 1 Tab by mouth two (2) times a day.  Qty: 60 Tab, Refills: 0      OLANZapine (ZYPREXA ZYDIS) 5 mg disintegrating tablet Take 1 Tab by mouth nightly.  Qty: 30 Tab, Refills: 0      predniSONE (DELTASONE) 10 mg tablet 3 tabs once a day for 2 days then  2 tabs once a day for 2 days then  1 tab once a day for 2 days then  1/2 tab once a day for 2 days then stop  Qty: 13 Tab, Refills: 0      sertraline (ZOLOFT) 25 mg tablet Take 1 Tab by mouth daily.  Qty: 30 Tab, Refills: 0         CONTINUE these medications which have NOT CHANGED    Details   fluticasone-vilanterol (BREO ELLIPTA) 100-25 mcg/dose inhaler Take 1 Puff by inhalation daily.      sodium chloride (SALINE NASAL) 0.65 % nasal spray 2 Sprays by Both  Nostrils route as needed for Congestion. Indications: Nasal Congestion      levothyroxine (SYNTHROID) 88 mcg tablet Take 88 mcg by mouth Daily (before breakfast).      tiotropium (SPIRIVA WITH HANDIHALER) 18 mcg inhalation capsule Take 1 Cap by inhalation daily.      OXYGEN-AIR DELIVERY SYSTEMS 2 L by Does Not Apply route.           Recent Results (from the past 12 hour(s))   METABOLIC PANEL, BASIC    Collection Time: 08/03/15  5:35 AM   Result Value Ref Range    Sodium 143 136 - 145 mmol/L    Potassium 4.0 3.5 - 5.3 mmol/L    Chloride 105 98 - 107 mmol/L    CO2 34 (H) 21 - 32 mmol/L    Anion gap 4 (L) 6 - 15 mmol/L    Glucose 99 70 - 110 mg/dL    BUN 20 (H) 7 - 18 MG/DL    Creatinine 1.610.39 (L) 0.60 - 1.30 MG/DL    BUN/Creatinine ratio 51 (H) 7 - 25      GFR est AA >60 >60 ml/min/1.2473m2    GFR est non-AA >60 >60 ml/min/1.5373m2    Calcium 8.3 (L) 8.5 - 10.1 MG/DL   CBC WITH AUTOMATED DIFF    Collection Time: 08/03/15  5:35 AM   Result Value Ref Range    WBC 8.2 4.5 - 10.8 K/uL    RBC 4.97 4.2 - 5.4 M/uL    HGB 9.7 (L) 12.0 - 16.0 g/dL    HCT 09.634.7 (L) 41 - 53 %    MCV 69.8 (L) 80 - 100 FL    MCH 19.5 (L) 27 - 31 PG    MCHC 28.0 (L) 31 - 37 g/dL    RDW 04.517.5 (H) 40.911.5 - 14.5 %    PLATELET 359 130 - 400 K/uL    MPV 9.5 5.9 - 10.3 FL    NEUTROPHILS 60 40 - 74 %    LYMPHOCYTES 31 19 - 48 %    MONOCYTES 8 3 - 9 %    EOSINOPHILS 1 0 - 5 %    ABS. NEUTROPHILS 4.9 1.5 - 8.0 K/UL  ABS. LYMPHOCYTES 2.5 0.8 - 3.5 K/UL    ABS. MONOCYTES 0.7 (L) 0.8 - 3.5 K/UL    ABS. EOSINOPHILS 0.1 0.0 - 0.5 K/UL    DF AUTOMATED      RBC COMMENTS 1+  HYPOCHROMIA       RBC COMMENTS 1+  ANISOCYTOSIS       RBC COMMENTS 1+  MICROCYTOSIS          Activity: Activity as tolerated    Diet: Regular Diet    Wound Care: None needed    Follow-up:     Follow-up with primary care provider in 1 week  You may need repeat CT scan of chest in next 3-4 months  With your pulmonologist  With behavioral unit as scheduled by intake coordinator       ROS:     Feeling well. No fever, breathing better, no chest pain, no vomiting    OBJECTIVE:   The patient appears well, alert, in no distress.   Visit Vitals   ??? BP 130/73 (BP 1 Location: Left arm, BP Patient Position: At rest)   ??? Pulse 97   ??? Temp 97.9 ??F (36.6 ??C)   ??? Resp 17   ??? Ht  (1.702 m)   ??? Wt 38.3 kg (84 lb 6.4 oz)   ??? SpO2 100%   ??? BMI 13.22 kg/m2     ENT normal.  Neck supple. No adenopathy or thyromegaly. PERLA.   Lungs are clear, good air entry.   Cardiovascular:S1 and S2.   Abdomen is soft without tenderness, guarding, mass or organomegaly.     Neurological non focal       Total time spent in discharge day management: 40 minutes

## 2015-08-03 NOTE — Progress Notes (Signed)
Per Lynford HumphreyShanda RN, patient can be discharged at this time. Discharge instructions given, scripts given x5. Educated patient regarding medication uses and side effects as well as importance of adherence. Printed education handout also given. Patient verbalized understanding of follow up appointments and importance of maintaining appointments but states that she cannot get a PCP because she hasn't been living here (this has been addressed very closely by CM, and PSYCH). Patient verbalized understanding of how and when to contact their MD if available or go to the nearest ER should complications arise. [facility]. Patient verbalized understanding of all discharge instructions. Unit number given should questions arise. [facility]. Delacruz.Gullinghanda RN aware.

## 2015-08-03 NOTE — Progress Notes (Signed)
CM f/u as Dr. Daria Pasturesloworaran spoke with patient and obtained verbal consent to speak with patient's sister and son [name] [name].  Called sister at [phone] and left voicemail message for return call.  Also called son [name] [name] at [phone] and left voicemail message as well. Notified Dr. Daria Pasturesloworaran.

## 2016-09-28 IMAGING — CR DG CHEST 2V
1 series · 2 of 2 positions shown · non-contrast
Comparison: None.

CLINICAL DATA: Shortness of breath, history of COPD

EXAM:
CHEST  2 VIEW

[Series 1: dg chest 2 view · 0.14mm/px · 2 of 2 slices shown]
[im 1/2]
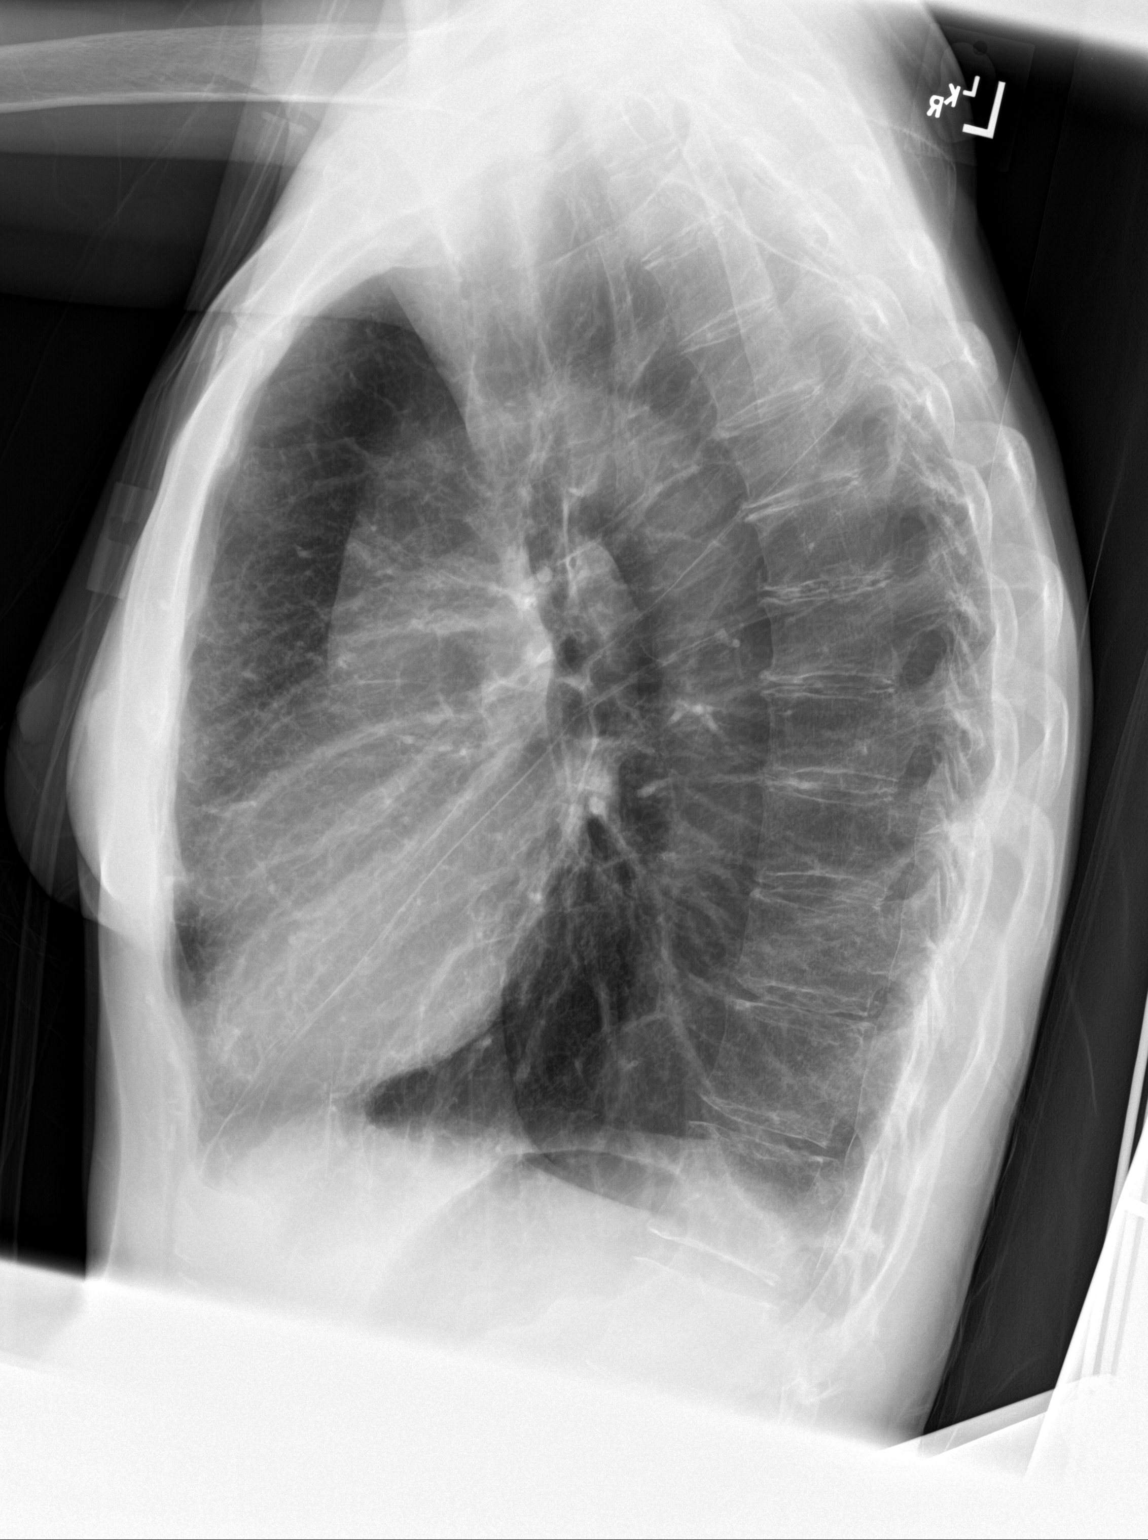
[im 2/2]
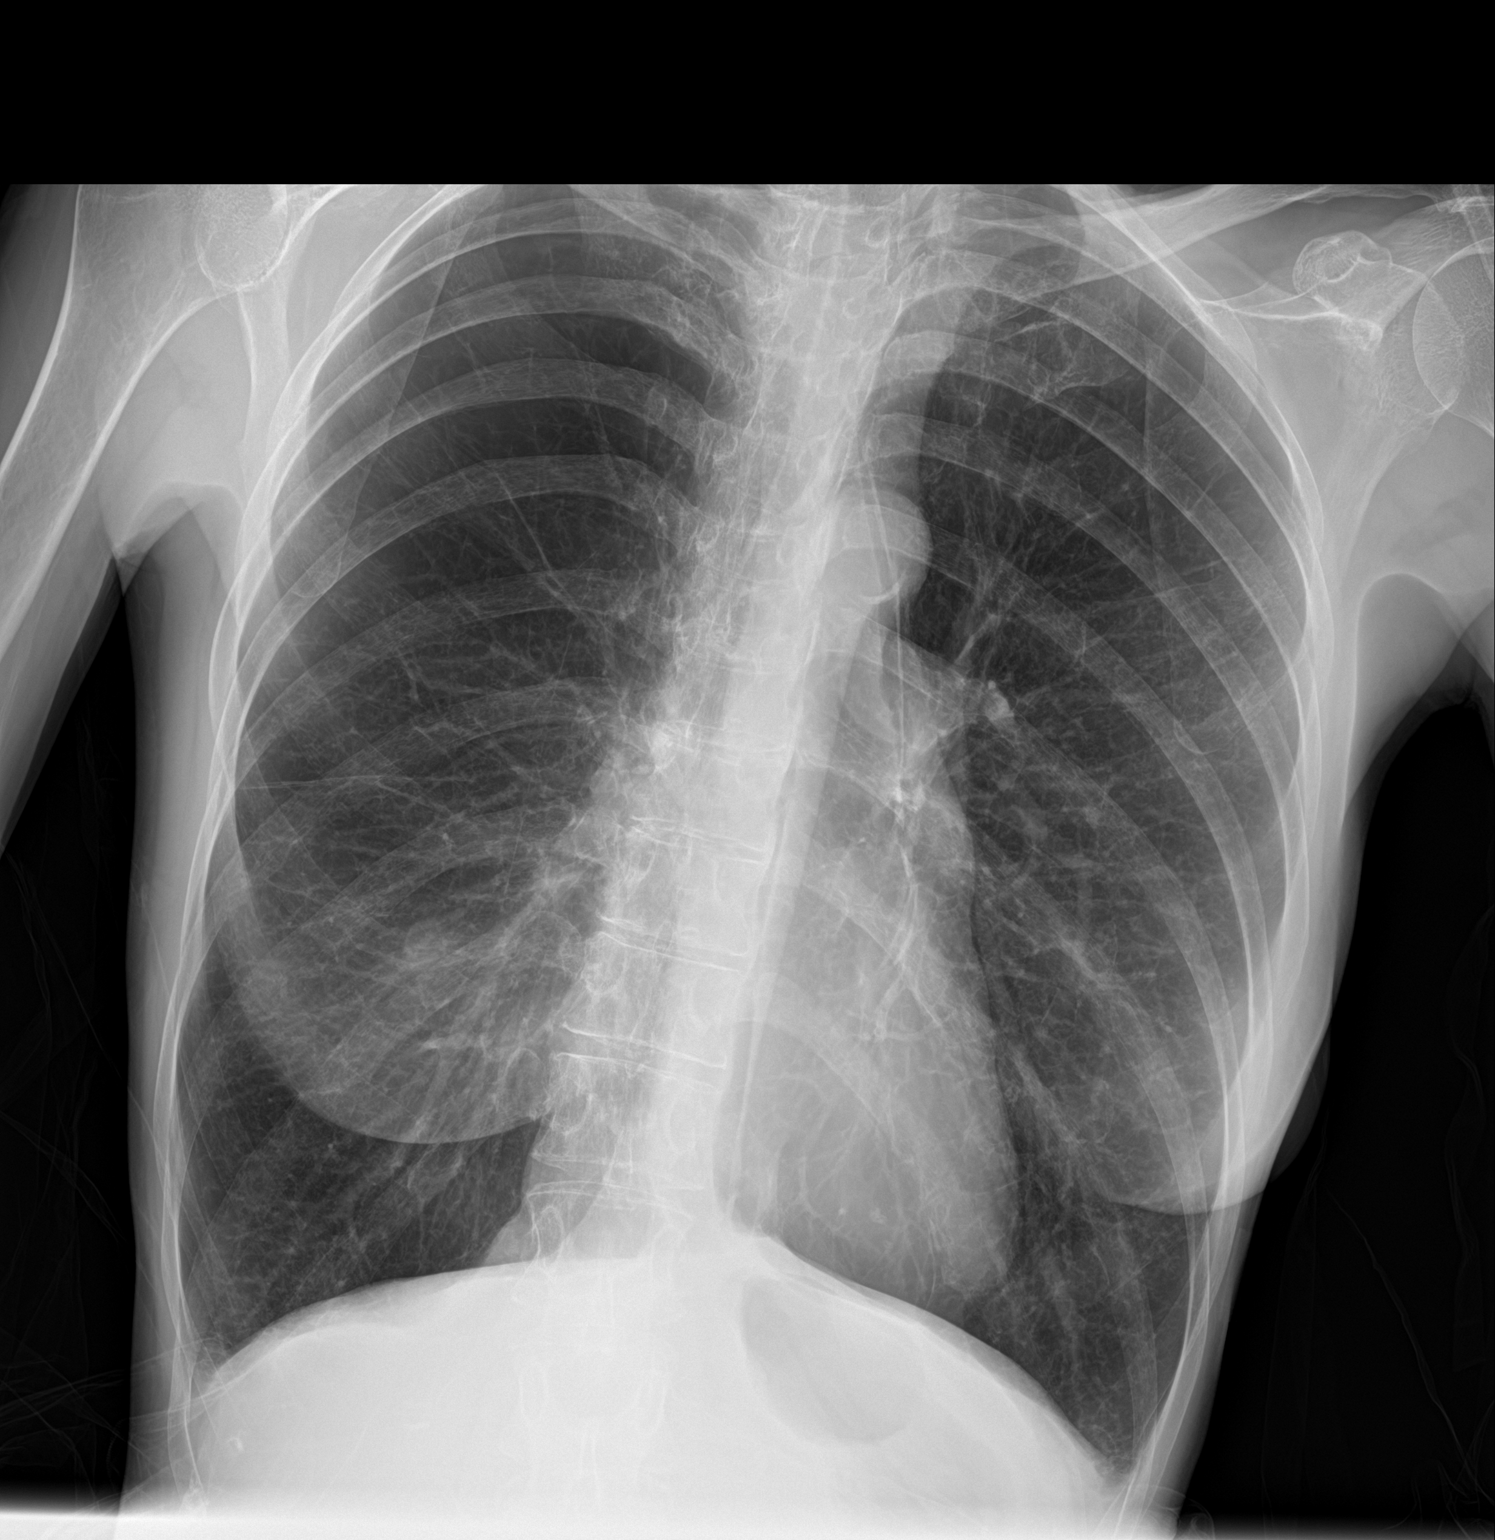

[2 of 2 positions shown; findings below may reference images not displayed]

FINDINGS: Cardiomediastinal silhouette is unremarkable. Hyperinflation is
noted. No acute infiltrate or pleural effusion. Osteopenia and mild
degenerative change thoracic spine.
IMPRESSION: No active disease. Hyperinflation is noted. Osteopenia and mild
degenerative changes thoracic spine.

## 2020-05-22 DEATH — deceased
